# Patient Record
Sex: Female | Born: 1975 | Race: Black or African American | Hispanic: No | Marital: Married | Smoking: Never smoker
Health system: Southern US, Community
[De-identification: ages and names within clinical notes are randomized; demographics above are authoritative.]

## PROBLEM LIST (undated history)

## (undated) ENCOUNTER — Inpatient Hospital Stay (HOSPITAL_COMMUNITY): Payer: Self-pay

## (undated) DIAGNOSIS — D563 Thalassemia minor: Secondary | ICD-10-CM

## (undated) DIAGNOSIS — Z789 Other specified health status: Secondary | ICD-10-CM

## (undated) DIAGNOSIS — I10 Essential (primary) hypertension: Secondary | ICD-10-CM

## (undated) HISTORY — DX: Essential (primary) hypertension: I10

## (undated) HISTORY — PX: BREAST BIOPSY: SHX20

---

## 2002-07-13 ENCOUNTER — Inpatient Hospital Stay (HOSPITAL_COMMUNITY): Admission: AD | Admit: 2002-07-13 | Discharge: 2002-07-13 | Payer: Self-pay

## 2002-07-17 ENCOUNTER — Ambulatory Visit (HOSPITAL_COMMUNITY): Admission: RE | Admit: 2002-07-17 | Discharge: 2002-07-17 | Payer: Self-pay

## 2002-07-19 ENCOUNTER — Encounter (INDEPENDENT_AMBULATORY_CARE_PROVIDER_SITE_OTHER): Payer: Self-pay | Admitting: Specialist

## 2002-07-19 ENCOUNTER — Ambulatory Visit (HOSPITAL_COMMUNITY): Admission: RE | Admit: 2002-07-19 | Discharge: 2002-07-19 | Payer: Self-pay

## 2004-05-04 ENCOUNTER — Inpatient Hospital Stay (HOSPITAL_COMMUNITY): Admission: AD | Admit: 2004-05-04 | Discharge: 2004-05-04 | Payer: Self-pay | Admitting: Obstetrics

## 2004-05-08 ENCOUNTER — Inpatient Hospital Stay (HOSPITAL_COMMUNITY): Admission: AD | Admit: 2004-05-08 | Discharge: 2004-05-09 | Payer: Self-pay | Admitting: Obstetrics

## 2004-12-02 ENCOUNTER — Inpatient Hospital Stay (HOSPITAL_COMMUNITY): Admission: AD | Admit: 2004-12-02 | Discharge: 2004-12-05 | Payer: Self-pay | Admitting: Gynecology

## 2004-12-02 ENCOUNTER — Encounter (INDEPENDENT_AMBULATORY_CARE_PROVIDER_SITE_OTHER): Payer: Self-pay | Admitting: *Deleted

## 2007-01-30 ENCOUNTER — Encounter: Admission: RE | Admit: 2007-01-30 | Discharge: 2007-01-30 | Payer: Self-pay | Admitting: Occupational Medicine

## 2008-01-10 ENCOUNTER — Ambulatory Visit (HOSPITAL_COMMUNITY): Admission: RE | Admit: 2008-01-10 | Discharge: 2008-01-10 | Payer: Self-pay | Admitting: Obstetrics

## 2008-01-19 ENCOUNTER — Encounter (INDEPENDENT_AMBULATORY_CARE_PROVIDER_SITE_OTHER): Payer: Self-pay | Admitting: Obstetrics

## 2008-01-19 ENCOUNTER — Ambulatory Visit (HOSPITAL_COMMUNITY): Admission: AD | Admit: 2008-01-19 | Discharge: 2008-01-19 | Payer: Self-pay | Admitting: Obstetrics & Gynecology

## 2008-11-11 ENCOUNTER — Ambulatory Visit (HOSPITAL_COMMUNITY): Admission: RE | Admit: 2008-11-11 | Discharge: 2008-11-11 | Payer: Self-pay | Admitting: Gynecology

## 2009-02-06 ENCOUNTER — Encounter: Admission: RE | Admit: 2009-02-06 | Discharge: 2009-02-06 | Payer: Self-pay | Admitting: Emergency Medicine

## 2009-04-07 ENCOUNTER — Encounter: Admission: RE | Admit: 2009-04-07 | Discharge: 2009-04-07 | Payer: Self-pay | Admitting: Internal Medicine

## 2009-04-16 ENCOUNTER — Encounter: Admission: RE | Admit: 2009-04-16 | Discharge: 2009-04-16 | Payer: Self-pay | Admitting: Occupational Medicine

## 2010-09-25 ENCOUNTER — Encounter: Payer: Self-pay | Admitting: Obstetrics & Gynecology

## 2010-11-29 ENCOUNTER — Encounter (HOSPITAL_BASED_OUTPATIENT_CLINIC_OR_DEPARTMENT_OTHER): Payer: BC Managed Care – PPO | Admitting: Internal Medicine

## 2010-11-29 ENCOUNTER — Other Ambulatory Visit: Payer: Self-pay | Admitting: Internal Medicine

## 2010-11-29 ENCOUNTER — Encounter: Payer: Self-pay | Admitting: Internal Medicine

## 2010-11-29 DIAGNOSIS — D563 Thalassemia minor: Secondary | ICD-10-CM

## 2010-11-29 LAB — CBC & DIFF AND RETIC
Basophils Absolute: 0 10*3/uL (ref 0.0–0.1)
EOS%: 6.1 % (ref 0.0–7.0)
HCT: 35.2 % (ref 34.8–46.6)
HGB: 11.3 g/dL — ABNORMAL LOW (ref 11.6–15.9)
MCH: 21.1 pg — ABNORMAL LOW (ref 25.1–34.0)
MCV: 65.7 fL — ABNORMAL LOW (ref 79.5–101.0)
MONO%: 5.3 % (ref 0.0–14.0)
NEUT%: 39.3 % (ref 38.4–76.8)

## 2010-12-01 LAB — COMPREHENSIVE METABOLIC PANEL
Albumin: 4.5 g/dL (ref 3.5–5.2)
BUN: 11 mg/dL (ref 6–23)
Calcium: 9.5 mg/dL (ref 8.4–10.5)
Chloride: 101 mEq/L (ref 96–112)
Glucose, Bld: 95 mg/dL (ref 70–99)
Potassium: 3.9 mEq/L (ref 3.5–5.3)

## 2010-12-01 LAB — IRON AND TIBC
%SAT: 23 % (ref 20–55)
Iron: 74 ug/dL (ref 42–145)

## 2010-12-01 LAB — FERRITIN: Ferritin: 16 ng/mL (ref 10–291)

## 2010-12-01 LAB — HEMOGLOBINOPATHY EVALUATION
Hgb A2 Quant: 5.6 % — ABNORMAL HIGH (ref 2.2–3.2)
Hgb A: 88.3 % — ABNORMAL LOW (ref 96.8–97.8)

## 2010-12-01 LAB — VITAMIN B12: Vitamin B-12: 954 pg/mL — ABNORMAL HIGH (ref 211–911)

## 2010-12-30 ENCOUNTER — Encounter (HOSPITAL_BASED_OUTPATIENT_CLINIC_OR_DEPARTMENT_OTHER): Payer: BC Managed Care – PPO | Admitting: Internal Medicine

## 2010-12-30 ENCOUNTER — Other Ambulatory Visit: Payer: Self-pay | Admitting: Internal Medicine

## 2010-12-30 DIAGNOSIS — D563 Thalassemia minor: Secondary | ICD-10-CM

## 2010-12-30 LAB — CBC WITH DIFFERENTIAL/PLATELET
BASO%: 0.7 % (ref 0.0–2.0)
EOS%: 7.3 % — ABNORMAL HIGH (ref 0.0–7.0)
LYMPH%: 56.8 % — ABNORMAL HIGH (ref 14.0–49.7)
MCH: 21.7 pg — ABNORMAL LOW (ref 25.1–34.0)
MCHC: 31.3 g/dL — ABNORMAL LOW (ref 31.5–36.0)
MONO#: 0.3 10*3/uL (ref 0.1–0.9)
Platelets: 206 10*3/uL (ref 145–400)
RBC: 5.09 10*6/uL (ref 3.70–5.45)
WBC: 4.3 10*3/uL (ref 3.9–10.3)

## 2011-01-18 NOTE — Op Note (Signed)
NAMEAngeliyah, [name]           ACCOUNT NO.:  [id number]   MEDICAL RECORD NO.:  [id number]          PATIENT TYPE:  AMB   LOCATION:  [facility]                          FACILITY:  [facility]   PHYSICIAN:  Kathreen Cosier, M.D.DATE OF BIRTH:  Feb 11, 1976   DATE OF PROCEDURE:  01/19/2008  DATE OF DISCHARGE:                               OPERATIVE REPORT   PREOPERATIVE DIAGNOSES:  1. An 8 weeks plus fetal demise.  2. Threatened abortion.   POSTOPERATIVE DIAGNOSES:  1. An 8 weeks plus fetal demise.  2. Threatened abortion.   PROCEDURE:  Dilatation and evacuation.   Using MAC and the patient in lithotomy position, the perineum and vagina  were prepped and draped.  Bladder emptied with straight catheter.  Bimanual exam, uterus 8-10 weeks' size.  Speculum was placed in the  vagina.  Cervix was injected with 10 mL of 1% Xylocaine.  The cervix was  dilated 27 Pratt and a #10 suction was used to aspirate the uterine  contents until the cavity was clean.  The patient tolerated the  procedure well and taken to recovery room in good condition.           ______________________________  Kathreen Cosier, M.D.     BAM/MEDQ  D:  01/19/2008  T:  01/20/2008  Job:  [id number]

## 2011-01-21 NOTE — Discharge Summary (Signed)
NAMELoany, [name]           ACCOUNT NO.:  [id number]   MEDICAL RECORD NO.:  [id number]          PATIENT TYPE:  INP   LOCATION:  9118                          [facility]ILITY:  [facility]   PHYSICIAN:  Charles A. Clearance Coots, M.D.DATE OF BIRTH:  [birth year]   DATE OF ADMISSION:  12/02/2004  DATE OF DISCHARGE:  12/05/2004                                 DISCHARGE SUMMARY   ADMITTING DIAGNOSES:  1.  Forty weeks gestation.  2.  Breech presentation.   DISCHARGE DIAGNOSES:  1.  Forty weeks gestation.  2.  Breech presentation.  3.  Status post primary low transverse cesarean section on December 02, 2004.      A viable female was delivered at 0922, Apgars of 9 at one minute 9 at five      minutes, weight of 8 pounds 1 ounce.  Mother and infant discharged home      in good condition.   REASON FOR ADMISSION:  A 35 year old G1 black female, estimated date of  confinement of November 29, 2004, presented for scheduled cesarean section  delivery for a breech presentation at [redacted] weeks gestation.   PAST MEDICAL HISTORY:   SURGERY:  Laparoscopy.   ILLNESSES:  None.   MEDICATIONS:  Prenatal vitamins.   ALLERGIES:  No known drug allergies.   SOCIAL HISTORY:  Negative for tobacco, alcohol, or recreational drug use.   PHYSICAL EXAMINATION:  GENERAL:  Well-nourished, well-developed female in no  acute distress.  VITAL SIGNS:  Temperature 98.1, pulse 78, blood pressure 129/78.  LUNGS:  Clear to auscultation bilaterally.  HEART:  Regular rate and rhythm.  ABDOMEN:  Gravid, nontender.  PELVIC:  Cervix long, closed, breech presentation.   ADMITTING LABORATORY VALUES:  Hemoglobin 10, hematocrit 33, white blood cell  count 5000, platelets 154,000.  Comprehensive metabolic panel within normal  limits.  RPR was nonreactive.   HOSPITAL COURSE:  The patient underwent a primary low transverse cesarean  section on December 02, 2004.  There were no intraoperative complications.  Postoperative course was uncomplicated.  The  patient was discharged home on  postop day #3 in good condition.   DISCHARGE LABORATORY VALUES:  Hemoglobin 8.6, hematocrit 26.1, white blood  cell count 6200, platelets 132,000.   DISCHARGE DISPOSITION:   MEDICATIONS:  1.  Continue prenatal vitamins.  2.  Iron was prescribed for anemia.   Routine written instructions per booklet were given for status post  discharge after cesarean section.  The patient is to call the office for a  follow up appointment in 2 weeks.      CAH/MEDQ  D:  01/06/2005  T:  01/06/2005  Job:  [id number]

## 2011-01-21 NOTE — Op Note (Signed)
[name], [name]           ACCOUNT NO.:  [id number]   MEDICAL RECORD NO.:  [id number]          PATIENT TYPE:  INP   LOCATION:  9118                          [facility]ILITY:  [facility]   PHYSICIAN:  Charles A. Clearance Coots, M.D.DATE OF BIRTH:  February 13, 1976   DATE OF PROCEDURE:  12/02/2004  DATE OF DISCHARGE:                                 OPERATIVE REPORT   PREOPERATIVE DIAGNOSES:  1.  [redacted] weeks gestation.  2.  Breech presentation.   POSTOPERATIVE DIAGNOSES:  1.  [redacted] weeks gestation.  2.  Breech presentation.   PROCEDURE:  Primary low transverse cesarean section.   SURGEON:  Charles A. Clearance Coots, M.D.   ASSISTANT:  Corey Skains, C.S.T.   ANESTHESIA:  Spinal.   ESTIMATED BLOOD LOSS:  1000 mL.   IV FLUIDS:  3000 mL.   URINE OUTPUT:  300 mL, clear.   COMPLICATIONS:  None.   DRAINS:  Foley to gravity.   FINDINGS:  Viable female at 29, Apgars of 9 at one minute and 9 at five  minutes, weight 8 pounds 1 ounce.  Normal uterus, ovaries, and fallopian  tubes.   DESCRIPTION OF PROCEDURE:  The patient was brought to the operating room.  After satisfactory spinal anesthesia, the abdomen was prepped and draped in  the usual sterile fashion.   A Pfannenstiel skin incision was made with the scalpel that was deepened  down to the fascia with the scalpel.  The fascia was nicked in the midline,  and the fascial incision was extended to the left and to the right with  curved Mayo scissors.  The superior and inferior fascial edges were taken  off of the rectus muscles with both blunt and sharp dissection.  The rectus  muscles were bluntly and sharply divided in the midline, and the peritoneum  was entered digitally and was digitally extended to the left and to the  right.  The bladder blade was then positioned, and the vesicouterine fold of  peritoneum above the reflection of urinary bladder was grasped with forceps  and was incised and undermined with Metzenbaum scissors.  The incision was  extended to the left and to the right with the Metzenbaum scissors.  The  bladder flap was bluntly developed, and the bladder blade was repositioned  in front of the urinary bladder, placing it well out of the operative field.  The uterus was then entered transversely and the lower uterine segment with  the scalpel down to the amniotic sac.  The uterine incision was then  extended to the left and to the right with the bandage scissors.  The  amniotic sac was ruptured, and clear fluid was expelled.  The delivery was  then accomplished as a frank breech delivery in routine fashion without  complication.  The umbilical cord was doubly clamped and cut, and the  infant's mouth and nose were suctioned with a suction bulb.  The infant was  then handed off to the nursery staff.  Cord blood was obtained, and the  placenta was spontaneously expelled from the uterine cavity intact.  The  edges of the uterine incision were then grasped with  ring forceps, and the  uterus was closed with a continuous interlocking suture of 0 Monocryl from  each corner to the center.  Hemostasis was excellent.  The pelvic cavity was  then thoroughly irrigated with warm saline solution, and all clots were  removed.  The abdomen was then closed as follows.  The peritoneum was closed  with a continuous suture of 2-0 Monocryl.  The fascia was closed with  continuous suture of 0 PDS from each corner to the center.  The subcutaneous  tissue was thoroughly irrigated with warm saline solution, and all areas of  subcutaneous bleeding were coagulated with the Bovie.  The skin was then  closed with a continuous subcuticular suture of 3-0 Monocryl.  A sterile  bandage was applied to the incision closure.  The surgical technician  indicated that all needle, sponge, and instrument counts were correct.   The patient tolerated the procedure well and was transported to the recovery  room in satisfactory condition.      CAH/MEDQ  D:   12/02/2004  T:  12/02/2004  Job:  [id number]

## 2011-01-21 NOTE — Op Note (Signed)
   NAME:  [name], [name]                      ACCOUNT NO.:  [id number]   MEDICAL RECORD NO.:  [id number]                   PATIENT TYPE:  AMB   LOCATION:  [facility]                                  [facility]   PHYSICIAN:  Ronda Fairly. Galen Daft, M.D.              DATE OF BIRTH:  12/21/1975   DATE OF PROCEDURE:  07/19/2002  DATE OF DISCHARGE:                                 OPERATIVE REPORT   PREOPERATIVE DIAGNOSES:  Incomplete abortion.   POSTOPERATIVE DIAGNOSES:  Incomplete abortion.   PROCEDURE:  Suction dilatation and evacuation.   SURGEON:  Ronda Fairly. Galen Daft, M.D.   ANESTHESIA:  MAC with local anesthesia.   COMPLICATIONS:  None.   ESTIMATED BLOOD LOSS:  Less than 10 cc.   PROCEDURE:  The patient was identified as [employer] representative.  Informed  consent was obtained and she was brought to the operating room.  The cervix  already had been dilated just naturally to accept the 10 mm suction curette.  Lidocaine was utilized for 10 cc total of 1% lidocaine and this was done in  all four quadrants of the cervix.  Care was taken to avoid intravascular  injection.  The uterus sounded to 10 cm.  The suction curette was placed  into the fundus.  Pressure was up to 60 and products of conception were  removed without difficulty.  Sharp curette in all the four quadrants showed  that there was a complete procedure without any additional tissue obtained.  The patient had no active bleeding at the end of the procedure.  She had  voided prior to the procedure and there were no complications.  She was  discharged home on doxycycline and Percocet and to follow up in the office  in three to four weeks.  All activity limits, wound care, follow-up in the  office, and diet discussed with the patient prior to discharge.  Instrument,  sponge, and needle count were correct at the end of the case and there were  no complications.                                               Ronda Fairly. Galen Daft,  M.D.    NJT/MEDQ  D:  07/19/2002  T:  07/19/2002  Job:  [id number]

## 2011-06-01 LAB — CBC
HCT: 35.6 — ABNORMAL LOW
Platelets: 229
WBC: 5

## 2011-06-01 LAB — ABO/RH: ABO/RH(D): O POS

## 2011-09-01 LAB — OB RESULTS CONSOLE GC/CHLAMYDIA
Chlamydia: NEGATIVE
Gonorrhea: NEGATIVE

## 2011-09-13 ENCOUNTER — Other Ambulatory Visit: Payer: Self-pay

## 2011-10-04 LAB — OB RESULTS CONSOLE RUBELLA ANTIBODY, IGM: Rubella: IMMUNE

## 2011-10-05 ENCOUNTER — Other Ambulatory Visit: Payer: Self-pay

## 2011-10-11 ENCOUNTER — Encounter (HOSPITAL_COMMUNITY): Payer: Self-pay

## 2011-10-11 ENCOUNTER — Ambulatory Visit (HOSPITAL_COMMUNITY)
Admission: RE | Admit: 2011-10-11 | Discharge: 2011-10-11 | Disposition: A | Discharge status: Home / Self Care | Payer: BC Managed Care – PPO | Source: Ambulatory Visit | Attending: Obstetrics & Gynecology | Admitting: Obstetrics & Gynecology

## 2011-10-11 DIAGNOSIS — D563 Thalassemia minor: Secondary | ICD-10-CM | POA: Insufficient documentation

## 2011-10-11 DIAGNOSIS — O2620 Pregnancy care for patient with recurrent pregnancy loss, unspecified trimester: Secondary | ICD-10-CM | POA: Insufficient documentation

## 2011-10-11 DIAGNOSIS — O09529 Supervision of elderly multigravida, unspecified trimester: Secondary | ICD-10-CM | POA: Insufficient documentation

## 2011-10-11 DIAGNOSIS — Z3689 Encounter for other specified antenatal screening: Secondary | ICD-10-CM

## 2011-10-11 DIAGNOSIS — O358XX0 Maternal care for other (suspected) fetal abnormality and damage, not applicable or unspecified: Secondary | ICD-10-CM

## 2011-10-11 NOTE — Progress Notes (Signed)
Genetic Counseling  High-Risk Gestation Note  Appointment Date:  10/11/2011 Referred By: Roseanna Rainbow, * Date of Birth:  08/26/1976 Attending: Rema Fendt, MD  Ms. Bama A Farewell was seen for genetic counseling because of a maternal age of 36 y.o. and given that she has beta thalassemia trait.   She was counseled regarding maternal age and the association with risk for chromosome conditions due to nondisjunction with aging of the ova.   We reviewed chromosomes, nondisjunction, and the associated 1 in 12 risk for fetal aneuploidy at [redacted]w[redacted]d gestation related to a maternal age of 36 y.o. at delivery.  She was counseled that the risk for aneuploidy decreases as gestational age increases, accounting for those pregnancies which spontaneously abort.  We specifically discussed Down syndrome (trisomy 24), trisomies 71 and 74, and sex chromosome aneuploidies (47,XXX and 47,XXY) including the common features and prognoses of each.   We reviewed available screening and diagnostic options.  Regarding screening tests, we discussed the options of First screen, Quad screen and ultrasound.  She understands that screening tests are used to modify a patient's a priori risk for aneuploidy, typically based on age.  This estimate provides a pregnancy specific risk assessment. We discussed another type of screening test, noninvasive prenatal testing (NIPT), which utilizes cell free fetal DNA found in the maternal circulation. This test is not diagnostic for chromosome conditions, but can provide information regarding the presence or absence of extra fetal DNA for chromosomes 13, 18 and 21. Thus, it would not identify or rule out all fetal aneuploidy. The reported detection rate is greater than 99% for Trisomy 21, greater than 97% for Trisomy 18, and is approximately 80% (8 out of 10) for Trisomy 13. The false positive rate is thought to be less than 0.1% for any of these conditions.  We also reviewed the  availability of diagnostic options including CVS and amniocentesis.  We discussed the risks, limitations, and benefits of each.    After reviewing these options, Ms. Zurn elected to have targeted ultrasound in the second trimester (scheduled for 11/15/2011), but declined all additional screening and testing for aneuploidy (including First screen, cell free fetal DNA testing, CVS, and amniocentesis) given that she feels these tests have the potential to provoke additional stress for her during pregnancy. She understands that ultrasound cannot rule out all birth defects or genetic syndromes. The patient was advised of this limitation and states she still does not want diagnostic testing at this time or in the future given the associated risk of complications.  However, she was counseled that 50-80% of fetuses with Down syndrome and up to 90% of fetuses with trisomies 13 and 18, when well visualized, have detectable anomalies or soft markers by ultrasound.   Both family histories were reviewed and found to be contributory for sickle cell disease. The patient previously had a complete blood count which indicated low mean corpuscular volume (MCV) (66.5), low mean corpuscular hemoglobin (21.2), and low hemoglobin (10.9). 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 Ms. Carillo reported that the father of the pregnancy is planning to have testing at her OB office in the near future. We discussed that testing is available to him via complete blood count, hemoglobin electrophoresis with quantitative A2, and ferritin studies. We are also available to facilitate this testing, if desired.   Hemoglobin is the oxygen-carrying pigment of  red blood cells. The type of hemoglobin we have is determined by inheritance. Thalassemias and  sickle cell disease are inherited blood disorders caused by abnormal production of hemoglobin. We discussed that Sickle Cell anemia (SCA), Beta Thalassemia, and sickle-beta thalassemia are hemoglobinopathies in which there is an inherited structural abnormality in one of the globin chains, specifically the beta globin chain. Beta-Thalassemia is a disease characterized by a decrease in the ability of the blood to carry oxygen, which often causes anemia, hepatosplenomegaly, and failure to thrive in infancy.  Treatment typically consists of regular blood transfusions and chelation therapy.   We reviewed the autosomal recessive inheritance of beta thalassemia and sickle cell disease, which are both due to mutations in the HBB gene. We discussed that if both parents are carriers for beta thalassemia or beta globin chain variant, each pregnancy has a 1 in 4 chance to inherit beta thalassemia or hemoglobinopathy. There is also a 1 in 4 chance to have a child who is not a carrier nor affected with hemoglobinopathy and a 1 in 2 chance to have a child who is a carrier. We discussed that if the father of the pregnancy has typical hemoglobin (Hgb AA) and is not a carrier for a beta globin chain variant, then the pregnancy would not be at risk for thalassemia or hemoglobinopathy but would have a 1 in 2 chance to be a carrier. We discussed that prenatal diagnosis is available via amniocentesis, if desired, in the case where both parents are identified carriers. If this is desired, molecular testing would first be needed to confirm the beta thalassemia carrier status in Ms. Maniscalco and identify the causative mutation. She understands that although the ultrasound may appear normal, the risk of anomalies cannot be completely eliminated, and that a baby with thalassemia would not appear different on a prenatal ultrasound. Ms. Garbett stated that her partner plans to pursue carrier testing. However, she stated that she would not  be interested in prenatal diagnosis via amniocentesis in the event that he is identified to carry a hemoglobin variant.   Additionally, Ms. Forge reported that she and her husband have a history of 4 first trimester miscarriages, in addition to their 40 year-old son. An underlying cause is not known for her miscarriages. Approximately 1 in 6 confirmed pregnancies results in miscarriage. A single underlying cause is more likely to be suspected when a couple has experienced 3 or more losses. It is less likely that there will be an identifiable single underlying cause when a couple has experienced less than 3 losses. We discussed several possible causes including chromosome rearrangements, antibodies, and thrombophilia. We reviewed chromosomes and examples of chromosome conditions. In approximately 3-8% of couples with recurrent pregnancy loss, one partner carries a chromosome variant, such as a balanced translocation. Being a carrier of a chromosome variant can increase the risk for abnormalities in the sperm or egg cell, which can increase the risk for miscarriage or the birth of a child with birth defects and/or mental retardation. We reviewed that inherited predisposition to clotting can also increase the risk for miscarriage given the association with increased risk for disrupted blood flow in the pregnancy. Additionally, the presence of certain antibodies have been associated with an increased risk for miscarriage. The patient may contact us should she desire pursuing studies in an attempt to identify an underlying cause for the previous miscarriages. The patient is not interested in testing at the time of today's visit. The family history is otherwise unremarkable for  birth defects, mental retardation, recurrent pregnancy loss, and known genetic conditions.  Without further information regarding the provided family history, an accurate genetic risk cannot be calculated. Further genetic counseling is  warranted if more information is obtained.  Ms. Gewirtz denied exposure to environmental toxins or chemical agents. She denied the use of alcohol, tobacco or street drugs. She denied significant viral illnesses during the course of her pregnancy. Her medical and surgical histories were contributory for four previous first trimester SABs.   I counseled Ms. Deziree A Genna regarding the above risks and available options.  The approximate face-to-face time with the genetic counselor was 25 minutes.  Quinn Plowman, MS,  Certified Genetic Counselor 10/11/2011

## 2011-11-15 ENCOUNTER — Other Ambulatory Visit: Payer: Self-pay

## 2011-11-15 ENCOUNTER — Ambulatory Visit (HOSPITAL_COMMUNITY)
Admission: RE | Admit: 2011-11-15 | Discharge: 2011-11-15 | Disposition: A | Discharge status: Home / Self Care | Payer: BC Managed Care – PPO | Source: Ambulatory Visit | Attending: Obstetrics & Gynecology | Admitting: Obstetrics & Gynecology

## 2011-11-15 ENCOUNTER — Encounter (HOSPITAL_COMMUNITY): Payer: Self-pay

## 2011-11-15 DIAGNOSIS — O09529 Supervision of elderly multigravida, unspecified trimester: Secondary | ICD-10-CM | POA: Insufficient documentation

## 2011-11-15 DIAGNOSIS — Z363 Encounter for antenatal screening for malformations: Secondary | ICD-10-CM | POA: Insufficient documentation

## 2011-11-15 DIAGNOSIS — O34219 Maternal care for unspecified type scar from previous cesarean delivery: Secondary | ICD-10-CM | POA: Insufficient documentation

## 2011-11-15 DIAGNOSIS — Z1389 Encounter for screening for other disorder: Secondary | ICD-10-CM | POA: Insufficient documentation

## 2011-11-15 DIAGNOSIS — O2620 Pregnancy care for patient with recurrent pregnancy loss, unspecified trimester: Secondary | ICD-10-CM | POA: Insufficient documentation

## 2011-11-15 DIAGNOSIS — Z3689 Encounter for other specified antenatal screening: Secondary | ICD-10-CM

## 2011-11-15 DIAGNOSIS — O358XX0 Maternal care for other (suspected) fetal abnormality and damage, not applicable or unspecified: Secondary | ICD-10-CM | POA: Insufficient documentation

## 2011-11-15 HISTORY — DX: Thalassemia minor: D56.3

## 2011-11-22 ENCOUNTER — Other Ambulatory Visit: Payer: Self-pay | Admitting: Obstetrics

## 2011-11-29 ENCOUNTER — Telehealth (HOSPITAL_COMMUNITY): Payer: Self-pay | Admitting: MS"

## 2011-11-29 NOTE — Telephone Encounter (Signed)
Left message for patient to return call.

## 2011-11-29 NOTE — Telephone Encounter (Signed)
Called [name] to discuss her [facility], cell free fetal DNA testing.  We reviewed that these are within normal limits, showing a less than 1 in 10,000 risk for trisomies 21, 18 and 13.  We reviewed that this testing identifies > 99% of pregnancies with trisomy 21, >97% of pregnancies with trisomy 50, and >80% with trisomy 52; the false positive rate is <0.1% for all conditions.  She understands that this testing does not identify all genetic conditions.   We reviewed the ultrasound finding of echogenic intracardiac focus. An isolated echogenic focus is generally believed to be a normal variation without any concerns for the pregnancy.  Isolated echogenic cardiac foci are not associated with congenital heart defects in the baby or compromised cardiac function after birth.    We also reviewed that her husband's hemoglobin electrophoresis, complete blood count, and ferritin studies were normal, indicating the presence of normal hemoglobin (Hb AA).    All questions were answered to her satisfaction, she was encouraged to call with additional questions or concerns.  Quinn Plowman, MS Patent attorney

## 2011-12-28 LAB — OB RESULTS CONSOLE HIV ANTIBODY (ROUTINE TESTING): HIV: NONREACTIVE

## 2012-01-11 ENCOUNTER — Inpatient Hospital Stay (HOSPITAL_COMMUNITY)
Admission: AD | Admit: 2012-01-11 | Discharge: 2012-01-11 | Disposition: A | Discharge status: Home / Self Care | Payer: BC Managed Care – PPO | Source: Ambulatory Visit | Attending: Obstetrics & Gynecology | Admitting: Obstetrics & Gynecology

## 2012-01-11 ENCOUNTER — Encounter (HOSPITAL_COMMUNITY): Payer: Self-pay | Admitting: *Deleted

## 2012-01-11 DIAGNOSIS — O99891 Other specified diseases and conditions complicating pregnancy: Secondary | ICD-10-CM | POA: Insufficient documentation

## 2012-01-11 DIAGNOSIS — R51 Headache: Secondary | ICD-10-CM | POA: Insufficient documentation

## 2012-01-11 DIAGNOSIS — O10019 Pre-existing essential hypertension complicating pregnancy, unspecified trimester: Secondary | ICD-10-CM | POA: Insufficient documentation

## 2012-01-11 DIAGNOSIS — O169 Unspecified maternal hypertension, unspecified trimester: Secondary | ICD-10-CM

## 2012-01-11 HISTORY — DX: Other specified health status: Z78.9

## 2012-01-11 LAB — URINALYSIS, ROUTINE W REFLEX MICROSCOPIC
Leukocytes, UA: NEGATIVE
Nitrite: NEGATIVE
Protein, ur: NEGATIVE mg/dL
Specific Gravity, Urine: 1.01 (ref 1.005–1.030)
Urobilinogen, UA: 0.2 mg/dL (ref 0.0–1.0)

## 2012-01-11 LAB — COMPREHENSIVE METABOLIC PANEL
ALT: 16 U/L (ref 0–35)
AST: 18 U/L (ref 0–37)
Albumin: 2.7 g/dL — ABNORMAL LOW (ref 3.5–5.2)
CO2: 23 mEq/L (ref 19–32)
Chloride: 102 mEq/L (ref 96–112)
GFR calc non Af Amer: >90 mL/min (ref >90)
Sodium: 135 mEq/L (ref 135–145)
Total Bilirubin: 0.3 mg/dL (ref 0.3–1.2)

## 2012-01-11 LAB — CBC
Platelets: 156 10*3/uL (ref 150–400)
RBC: 4.37 MIL/uL (ref 3.87–5.11)
RDW: 17.7 % — ABNORMAL HIGH (ref 11.5–15.5)
WBC: 6 10*3/uL (ref 4.0–10.5)

## 2012-01-11 MED ORDER — OXYCODONE-ACETAMINOPHEN 5-325 MG PO TABS: 1.0000 | ORAL_TABLET | ORAL | Status: AC | PRN

## 2012-01-11 NOTE — MAU Provider Note (Signed)
History     CSN: [id number]  Arrival date and time: 01/11/12 1950   First Provider Initiated Contact with Patient 01/11/12 2033      Chief Complaint  Patient presents with  . Headache   HPI This is a 36 y.o. female at [redacted]w[redacted]d who presents with complaint of Headache. She took Tylenol 3000mg  in past day, but last at 3am.  She took her BP at home and it was elevated.  Pt reports "since last night i have had a terrible headache and i have taken 3000 mg of tylenol since last pm and it hasn't helped at all" states she checked her b/p at home 189/90.       OB History    Grav Para Term Preterm Abortions TAB SAB Ect Mult Living   6 1 1  0 4 0 4 0 0 1     Filed Vitals:   01/11/12 2003  BP: 131/87  Pulse: 95  Temp:   Resp:      Past Medical History  Diagnosis Date  . Beta thalassemia trait   . No pertinent past medical history     Past Surgical History  Procedure Date  . Cesarean section     Family History  Problem Relation Age of Onset  . Sickle cell anemia Brother   . Hypertension Mother     History  Substance Use Topics  . Smoking status: Never Smoker   . Smokeless tobacco: Not on file  . Alcohol Use: No    Allergies:  Allergies  Allergen Reactions  . Amoxicillin Rash    Prescriptions prior to admission  Medication Sig Dispense Refill  . loratadine (CLARITIN) 10 MG tablet Take 10 mg by mouth daily as needed. For cough/allergies      . omeprazole (PRILOSEC) 20 MG capsule Take 20 mg by mouth every morning.      . ondansetron (ZOFRAN-ODT) 8 MG disintegrating tablet Take 8 mg by mouth every 8 (eight) hours as needed. For nausea and vomiting      . Prenatal Vit-Fe Fumarate-FA (PRENATAL MULTIVITAMIN) TABS Take 1 tablet by mouth daily.        Review of Systems  Constitutional: Negative for fever and chills.  Eyes: Negative for blurred vision.  Gastrointestinal: Negative for abdominal pain.  Neurological: Positive for headaches.    Physical Exam   Blood  pressure 131/87, pulse 95, temperature 97.6 F (36.4 C), temperature source Oral, resp. rate 20, height 5\' 5"  (1.651 m), weight 208 lb (94.348 kg), last menstrual period 07/08/2011, SpO2 100.00%.  Physical Exam  Constitutional: She is oriented to person, place, and time. She appears well-developed and well-nourished.  HENT:  Head: Normocephalic.  Cardiovascular: Normal rate.   Respiratory: Effort normal.  GI: Soft.  Musculoskeletal: Normal range of motion.  Neurological: She is alert and oriented to person, place, and time. She has normal reflexes. She displays normal reflexes.  Skin: Skin is warm and dry.  Psychiatric: She has a normal mood and affect.   FHR reassuring. No contractions  MAU Course  Procedures  MDM Results for orders placed during the hospital encounter of 01/11/12 (from the past 24 hour(s))  URINALYSIS, ROUTINE W REFLEX MICROSCOPIC     Status: Normal   Collection Time   01/11/12  8:05 PM      Component Value Range   Color, Urine YELLOW  YELLOW    APPearance CLEAR  CLEAR    Specific Gravity, Urine 1.010  1.005 - 1.030    pH  6.5  5.0 - 8.0    Glucose, UA NEGATIVE  NEGATIVE (mg/dL)   Hgb urine dipstick NEGATIVE  NEGATIVE    Bilirubin Urine NEGATIVE  NEGATIVE    Ketones, ur NEGATIVE  NEGATIVE (mg/dL)   Protein, ur NEGATIVE  NEGATIVE (mg/dL)   Urobilinogen, UA 0.2  0.0 - 1.0 (mg/dL)   Nitrite NEGATIVE  NEGATIVE    Leukocytes, UA NEGATIVE  NEGATIVE   CBC     Status: Abnormal   Collection Time   01/11/12  8:35 PM      Component Value Range   WBC 6.0  4.0 - 10.5 (K/uL)   RBC 4.37  3.87 - 5.11 (MIL/uL)   Hemoglobin 9.6 (*) 12.0 - 15.0 (g/dL)   HCT 82.9 (*) 56.2 - 46.0 (%)   MCV 65.0 (*) 78.0 - 100.0 (fL)   MCH 22.0 (*) 26.0 - 34.0 (pg)   MCHC 33.8  30.0 - 36.0 (g/dL)   RDW 13.0 (*) 86.5 - 15.5 (%)   Platelets 156  150 - 400 (K/uL)  COMPREHENSIVE METABOLIC PANEL     Status: Abnormal   Collection Time   01/11/12  8:35 PM      Component Value Range   Sodium 135   135 - 145 (mEq/L)   Potassium 3.6  3.5 - 5.1 (mEq/L)   Chloride 102  96 - 112 (mEq/L)   CO2 23  19 - 32 (mEq/L)   Glucose, Bld 121 (*) 70 - 99 (mg/dL)   BUN 8  6 - 23 (mg/dL)   Creatinine, Ser 7.84  0.50 - 1.10 (mg/dL)   Calcium 9.2  8.4 - 69.6 (mg/dL)   Total Protein 6.4  6.0 - 8.3 (g/dL)   Albumin 2.7 (*) 3.5 - 5.2 (g/dL)   AST 18  0 - 37 (U/L)   ALT 16  0 - 35 (U/L)   Alkaline Phosphatase 62  39 - 117 (U/L)   Total Bilirubin 0.3  0.3 - 1.2 (mg/dL)   GFR calc non Af Amer >90  >90 (mL/min)   GFR calc Af Amer >90  >90 (mL/min)  URIC ACID     Status: Normal   Collection Time   01/11/12  8:35 PM      Component Value Range   Uric Acid, Serum 3.4  2.4 - 7.0 (mg/dL)     Assessment and Plan  A:  SIUP at [redacted]w[redacted]d        Headache, improved       Labile hypertension, no evidence of preeclampsia P:  Discussed with Dr Tamela Oddi       Labs normal       Will Rx Percocet PRN       Followup in office   [facility] 01/11/2012, 9:54 PM

## 2012-01-11 NOTE — Discharge Instructions (Signed)

## 2012-01-11 NOTE — MAU Note (Signed)
Pt reports "since last night i have had a terrible headache and i have taken 3000 mg of tylenol since last pm and it hasn't helped at all" states she checked her b/p at home 189/90.

## 2012-01-25 ENCOUNTER — Other Ambulatory Visit: Payer: Self-pay

## 2012-02-06 ENCOUNTER — Other Ambulatory Visit: Payer: Self-pay | Admitting: Obstetrics

## 2012-02-06 DIAGNOSIS — O09529 Supervision of elderly multigravida, unspecified trimester: Secondary | ICD-10-CM

## 2012-02-08 ENCOUNTER — Ambulatory Visit (HOSPITAL_COMMUNITY)
Admission: RE | Admit: 2012-02-08 | Discharge: 2012-02-08 | Disposition: A | Discharge status: Home / Self Care | Payer: BC Managed Care – PPO | Source: Ambulatory Visit | Attending: Obstetrics | Admitting: Obstetrics

## 2012-02-08 DIAGNOSIS — O34219 Maternal care for unspecified type scar from previous cesarean delivery: Secondary | ICD-10-CM | POA: Insufficient documentation

## 2012-02-08 DIAGNOSIS — O2620 Pregnancy care for patient with recurrent pregnancy loss, unspecified trimester: Secondary | ICD-10-CM | POA: Insufficient documentation

## 2012-02-08 DIAGNOSIS — O09529 Supervision of elderly multigravida, unspecified trimester: Secondary | ICD-10-CM | POA: Insufficient documentation

## 2012-02-08 NOTE — Progress Notes (Signed)
Patient seen today  for follow up ultrasound.  See full report in [facility].  Alpha Gula, MD  Single IUP at 30 5/7 weeks Interval growth is appropriate (65th %) Normal amniotic fluid volume  Recommend follow up ultrasounds as clinically indicated

## 2012-02-08 NOTE — ED Notes (Signed)
Pt states + FM.  Denies any problems today.   

## 2012-03-15 ENCOUNTER — Other Ambulatory Visit: Payer: Self-pay | Admitting: Obstetrics

## 2012-03-19 LAB — OB RESULTS CONSOLE GBS: GBS: NEGATIVE

## 2012-04-16 ENCOUNTER — Inpatient Hospital Stay (HOSPITAL_COMMUNITY)
Admission: AD | Admit: 2012-04-16 | Discharge: 2012-04-20 | DRG: 371 | Disposition: A | Discharge status: Home / Self Care | Payer: BC Managed Care – PPO | Source: Ambulatory Visit | Attending: Obstetrics | Admitting: Obstetrics

## 2012-04-16 ENCOUNTER — Encounter (HOSPITAL_COMMUNITY): Payer: Self-pay | Admitting: Anesthesiology

## 2012-04-16 ENCOUNTER — Encounter (HOSPITAL_COMMUNITY): Payer: Self-pay | Admitting: *Deleted

## 2012-04-16 DIAGNOSIS — O139 Gestational [pregnancy-induced] hypertension without significant proteinuria, unspecified trimester: Secondary | ICD-10-CM | POA: Diagnosis present

## 2012-04-16 DIAGNOSIS — O09529 Supervision of elderly multigravida, unspecified trimester: Secondary | ICD-10-CM | POA: Diagnosis present

## 2012-04-16 DIAGNOSIS — O34219 Maternal care for unspecified type scar from previous cesarean delivery: Principal | ICD-10-CM | POA: Diagnosis present

## 2012-04-16 LAB — COMPREHENSIVE METABOLIC PANEL
ALT: 12 U/L (ref 0–35)
Alkaline Phosphatase: 111 U/L (ref 39–117)
BUN: 7 mg/dL (ref 6–23)
CO2: 21 mEq/L (ref 19–32)
Chloride: 103 mEq/L (ref 96–112)
GFR calc Af Amer: >90 mL/min (ref >90)
GFR calc non Af Amer: >90 mL/min (ref >90)
Glucose, Bld: 78 mg/dL (ref 70–99)
Potassium: 3.9 mEq/L (ref 3.5–5.1)
Sodium: 135 mEq/L (ref 135–145)
Total Bilirubin: 0.3 mg/dL (ref 0.3–1.2)
Total Protein: 6.1 g/dL (ref 6.0–8.3)

## 2012-04-16 LAB — CBC WITH DIFFERENTIAL/PLATELET
Eosinophils Absolute: 0.1 10*3/uL (ref 0.0–0.7)
Hemoglobin: 9.6 g/dL — ABNORMAL LOW (ref 12.0–15.0)
Lymphocytes Relative: 25 % (ref 12–46)
Lymphs Abs: 1.4 10*3/uL (ref 0.7–4.0)
MCH: 20.6 pg — ABNORMAL LOW (ref 26.0–34.0)
Monocytes Relative: 6 % (ref 3–12)
Neutro Abs: 3.9 10*3/uL (ref 1.7–7.7)
Neutrophils Relative %: 68 % (ref 43–77)
Platelets: 124 10*3/uL — ABNORMAL LOW (ref 150–400)
RBC: 4.65 MIL/uL (ref 3.87–5.11)
WBC: 5.7 10*3/uL (ref 4.0–10.5)

## 2012-04-16 LAB — URINALYSIS, ROUTINE W REFLEX MICROSCOPIC
Glucose, UA: NEGATIVE mg/dL
Ketones, ur: NEGATIVE mg/dL
Leukocytes, UA: NEGATIVE
Nitrite: NEGATIVE
Specific Gravity, Urine: 1.01 (ref 1.005–1.030)
pH: 7.5 (ref 5.0–8.0)

## 2012-04-16 LAB — URIC ACID: Uric Acid, Serum: 4.6 mg/dL (ref 2.4–7.0)

## 2012-04-16 MED ORDER — LACTATED RINGERS IV SOLN: 500.0000 mL | INTRAVENOUS | Status: DC | PRN

## 2012-04-16 MED ORDER — CITRIC ACID-SODIUM CITRATE 334-500 MG/5ML PO SOLN
30.0000 mL | ORAL | Status: DC | PRN
  Administered 2012-04-17 (×2): 30 mL via ORAL
  Filled 2012-04-16 (×2): qty 15

## 2012-04-16 MED ORDER — TERBUTALINE SULFATE 1 MG/ML IJ SOLN: 0.2500 mg | Freq: Once | INTRAMUSCULAR | Status: AC | PRN

## 2012-04-16 MED ORDER — ACETAMINOPHEN 325 MG PO TABS: 650.0000 mg | ORAL_TABLET | ORAL | Status: DC | PRN

## 2012-04-16 MED ORDER — IBUPROFEN 600 MG PO TABS: 600.0000 mg | ORAL_TABLET | Freq: Four times a day (QID) | ORAL | Status: DC | PRN

## 2012-04-16 MED ORDER — FLEET ENEMA 7-19 GM/118ML RE ENEM: 1.0000 | ENEMA | RECTAL | Status: DC | PRN

## 2012-04-16 MED ORDER — OXYTOCIN 40 UNITS IN LACTATED RINGERS INFUSION - SIMPLE MED
1.0000 m[IU]/min | INTRAVENOUS | Status: DC
  Administered 2012-04-16: 2 m[IU]/min via INTRAVENOUS
  Filled 2012-04-16: qty 1000

## 2012-04-16 MED ORDER — ONDANSETRON HCL 4 MG/2ML IJ SOLN: 4.0000 mg | Freq: Four times a day (QID) | INTRAMUSCULAR | Status: DC | PRN

## 2012-04-16 MED ORDER — BUTALBITAL-APAP-CAFFEINE 50-325-40 MG PO TABS
2.0000 | ORAL_TABLET | Freq: Four times a day (QID) | ORAL | Status: DC | PRN
  Administered 2012-04-16: 2 via ORAL
  Filled 2012-04-16 (×2): qty 2

## 2012-04-16 MED ORDER — LIDOCAINE HCL (PF) 1 % IJ SOLN: 30.0000 mL | INTRAMUSCULAR | Status: DC | PRN

## 2012-04-16 MED ORDER — TERBUTALINE SULFATE 1 MG/ML IJ SOLN: 0.2500 mg | Freq: Once | INTRAMUSCULAR | Status: DC | PRN

## 2012-04-16 MED ORDER — LACTATED RINGERS IV SOLN
INTRAVENOUS | Status: DC
  Administered 2012-04-17: 08:00:00 via INTRAVENOUS

## 2012-04-16 MED ORDER — OXYTOCIN 40 UNITS IN LACTATED RINGERS INFUSION - SIMPLE MED: 62.5000 mL/h | Freq: Once | INTRAVENOUS | Status: DC

## 2012-04-16 MED ORDER — OXYCODONE-ACETAMINOPHEN 5-325 MG PO TABS: 1.0000 | ORAL_TABLET | ORAL | Status: DC | PRN

## 2012-04-16 MED ORDER — OXYTOCIN BOLUS FROM INFUSION
250.0000 mL | Freq: Once | INTRAVENOUS | Status: DC
  Filled 2012-04-16: qty 500

## 2012-04-16 MED ORDER — LABETALOL HCL 5 MG/ML IV SOLN: 20.0000 mg | Freq: Once | INTRAVENOUS | Status: DC

## 2012-04-16 NOTE — H&P (Signed)
[name] is a 36 y.o. female presenting for IOL. Maternal Medical History:  Reason for admission: 36 yo G6 P1   EDC 04-13-12.  Previous C/S for breech.  Seen in office with elevated BP.  Sent to [facility] for further evaluation and BP continued to be elevated.  Fetal activity: Perceived fetal activity is normal.   Last perceived fetal movement was within the past hour.    Prenatal complications: no prenatal complications   OB History    Grav Para Term Preterm Abortions TAB SAB Ect Mult Living   6 1 1  0 4 0 4 0 0 1     Past Medical History  Diagnosis Date  . Beta thalassemia trait   . No pertinent past medical history    Past Surgical History  Procedure Date  . Cesarean section    Family History: family history includes Hypertension in her mother and Sickle cell anemia in her brother. Social History:  reports that she has never smoked. She does not have any smokeless tobacco history on file. She reports that she does not drink alcohol or use illicit drugs.   Prenatal Transfer Tool  Maternal Diabetes: No Genetic Screening: Normal Maternal Ultrasounds/Referrals: Normal Fetal Ultrasounds or other Referrals:  None Maternal Substance Abuse:  No Significant Maternal Medications:  Meds include: Other: see prenatal record Significant Maternal Lab Results:  Lab values include: Group B Strep negative Other Comments:  None  Review of Systems  All other systems reviewed and are negative.    Dilation: Fingertip Effacement (%): 50 Exam by:: Ginger Morris RN Blood pressure 141/102, pulse 96, temperature 97.3 F (36.3 C), temperature source Oral, resp. rate 18, height 5\' 5"  (1.651 m), weight 106.142 kg (234 lb), last menstrual period 07/08/2011. Maternal Exam:  Abdomen: Patient reports no abdominal tenderness. Surgical scars: low transverse.   Fetal presentation: vertex  Introitus: Normal vulva. Normal vagina.  Pelvis: questionable for delivery.   Cervix: Cervix evaluated by  digital exam.     Physical Exam  Nursing note and vitals reviewed. Constitutional: She is oriented to person, place, and time. She appears well-developed and well-nourished.  HENT:  Head: Normocephalic and atraumatic.  Eyes: Conjunctivae are normal. Pupils are equal, round, and reactive to light.  Neck: Normal range of motion. Neck supple.  Cardiovascular: Normal rate and regular rhythm.   Respiratory: Effort normal.  GI: Soft.  Genitourinary: Vagina normal and uterus normal.  Musculoskeletal: Normal range of motion.  Neurological: She is alert and oriented to person, place, and time.  Skin: Skin is warm and dry.  Psychiatric: She has a normal mood and affect. Her behavior is normal. Judgment and thought content normal.    Prenatal labs: ABO, Rh: --/--/O POS (08/12 1145) Antibody: PENDING (08/12 1145) Rubella: Immune (01/29 0000) RPR: Nonreactive (04/24 0000)  HBsAg: Negative (01/29 0000)  HIV: Non-reactive (04/24 0000)  GBS: Negative (07/15 0000)   Assessment/Plan: 40 weeks.  Increased BP.  IOL.  VBAC.   Keshon Markovitz A 04/16/2012, 2:51 PM

## 2012-04-16 NOTE — MAU Note (Signed)
Spoke with Dr Clearance Coots with update on pt's status, orders received

## 2012-04-16 NOTE — Anesthesia Preprocedure Evaluation (Addendum)
Anesthesia Evaluation  Patient identified by MRN, date of birth, ID band Patient awake    Reviewed: Allergy & Precautions, H&P , Patient's Chart, lab work & pertinent test results  Airway Mallampati: II TM Distance: >3 FB Neck ROM: Full    Dental No notable dental hx. (+) Teeth Intact   Pulmonary neg pulmonary ROS,  breath sounds clear to auscultation  Pulmonary exam normal       Cardiovascular negative cardio ROS  Rhythm:Regular Rate:Normal     Neuro/Psych negative neurological ROS  negative psych ROS   GI/Hepatic Neg liver ROS, GERD-  Medicated and Controlled,  Endo/Other  Morbid obesity  Renal/GU negative Renal ROS  negative genitourinary   Musculoskeletal negative musculoskeletal ROS (+)   Abdominal (+) + obese,   Peds  Hematology Beta Thalassemia trait    Anesthesia Other Findings   Reproductive/Obstetrics (+) Pregnancy                          Anesthesia Physical Anesthesia Plan  ASA: III  Anesthesia Plan: Epidural   Post-op Pain Management:    Induction:   Airway Management Planned: Natural Airway  Additional Equipment:   Intra-op Plan:   Post-operative Plan:   Informed Consent: I have reviewed the patients History and Physical, chart, labs and discussed the procedure including the risks, benefits and alternatives for the proposed anesthesia with the patient or authorized representative who has indicated his/her understanding and acceptance.     Plan Discussed with: CRNA, Anesthesiologist and Surgeon  Anesthesia Plan Comments:         Anesthesia Quick Evaluation

## 2012-04-16 NOTE — MAU Note (Signed)
Pt was sent over from Dr Verdell Carmine office for [facility] labs and fetal monitoring

## 2012-04-16 NOTE — MAU Provider Note (Signed)
History     CSN: [id number]  Arrival date and time: 04/16/12 1110   First Provider Initiated Contact with Patient 04/16/12 1223      Chief Complaint  Patient presents with  . Hypertension   HPI [name] is a 36 y.o. female @ [redacted]w[redacted]d gestation who presents to MAU with elevated blood pressure. She was evaluated in the office and sent to MAU for serial blood pressures and PIH labs. Went in for regular prenatal visit today and was complaining of headache and difficulty sleeping.  The history was provided by the patient.  OB History    Grav Para Term Preterm Abortions TAB SAB Ect Mult Living   6 1 1  0 4 0 4 0 0 1      Past Medical History  Diagnosis Date  . Beta thalassemia trait   . No pertinent past medical history     Past Surgical History  Procedure Date  . Cesarean section     Family History  Problem Relation Age of Onset  . Sickle cell anemia Brother   . Hypertension Mother     History  Substance Use Topics  . Smoking status: Never Smoker   . Smokeless tobacco: Not on file  . Alcohol Use: No    Allergies:  Allergies  Allergen Reactions  . Amoxicillin Rash    Prescriptions prior to admission  Medication Sig Dispense Refill  . acetaminophen (TYLENOL) 500 MG tablet Take 500 mg by mouth every 6 (six) hours as needed. For pain.      Marland Kitchen loratadine (CLARITIN) 10 MG tablet Take 10 mg by mouth daily as needed. For cough/allergies      . omeprazole (PRILOSEC) 20 MG capsule Take 20 mg by mouth every morning.      . ondansetron (ZOFRAN-ODT) 8 MG disintegrating tablet Take 8 mg by mouth every 8 (eight) hours as needed. For nausea and vomiting      . Pyridoxine HCl (VITAMIN B-6) 25 MG tablet Take 25 mg by mouth daily.        Review of Systems  Constitutional: Negative for fever, chills and weight loss.  HENT: Positive for congestion. Negative for ear pain, nosebleeds, sore throat and neck pain.   Eyes: Negative for blurred vision, double vision, photophobia  and pain.  Respiratory: Positive for cough. Negative for shortness of breath and wheezing.   Cardiovascular: Negative for chest pain, palpitations and leg swelling.  Gastrointestinal: Positive for heartburn, nausea and constipation. Negative for vomiting, abdominal pain and diarrhea.  Genitourinary: Positive for frequency. Negative for dysuria and urgency.  Musculoskeletal: Positive for back pain. Negative for myalgias.  Skin: Negative for itching and rash.  Neurological: Positive for headaches. Negative for dizziness, sensory change, speech change, seizures and weakness.  Endo/Heme/Allergies: Does not bruise/bleed easily.  Psychiatric/Behavioral: Negative for depression. The patient has insomnia. The patient is not nervous/anxious.    Physical Exam   Blood pressure 147/94, pulse 82, temperature 97.3 F (36.3 C), temperature source Oral, resp. rate 18, height 5\' 5"  (1.651 m), weight 234 lb (106.142 kg), last menstrual period 07/08/2011.  Physical Exam  Nursing note and vitals reviewed. Constitutional: She is oriented to person, place, and time. She appears well-developed and well-nourished. No distress.  HENT:  Head: Normocephalic and atraumatic.  Eyes: EOM are normal.  Neck: Neck supple.  Cardiovascular: Normal rate.   Respiratory: Effort normal.  GI: Soft. There is no tenderness.       gravid  Genitourinary:  Dilation: Fingertip Effacement (%): 50 Cervical Position: Middle Exam by:: Ginger Morris RN  Musculoskeletal: Normal range of motion.  Neurological: She is alert and oriented to person, place, and time.  Skin: Skin is warm and dry.  Psychiatric: She has a normal mood and affect. Her behavior is normal. Judgment and thought content normal.   Results for orders placed during the hospital encounter of 04/16/12 (from the past 24 hour(s))  URINALYSIS, ROUTINE W REFLEX MICROSCOPIC     Status: Normal   Collection Time   04/16/12 11:30 AM      Component Value Range    Color, Urine YELLOW  YELLOW   APPearance CLEAR  CLEAR   Specific Gravity, Urine 1.010  1.005 - 1.030   pH 7.5  5.0 - 8.0   Glucose, UA NEGATIVE  NEGATIVE mg/dL   Hgb urine dipstick NEGATIVE  NEGATIVE   Bilirubin Urine NEGATIVE  NEGATIVE   Ketones, ur NEGATIVE  NEGATIVE mg/dL   Protein, ur NEGATIVE  NEGATIVE mg/dL   Urobilinogen, UA 0.2  0.0 - 1.0 mg/dL   Nitrite NEGATIVE  NEGATIVE   Leukocytes, UA NEGATIVE  NEGATIVE  LACTATE DEHYDROGENASE     Status: Normal   Collection Time   04/16/12 11:45 AM      Component Value Range   LDH 151  94 - 250 U/L  CBC WITH DIFFERENTIAL     Status: Abnormal   Collection Time   04/16/12 11:45 AM      Component Value Range   WBC 5.7  4.0 - 10.5 K/uL   RBC 4.65  3.87 - 5.11 MIL/uL   Hemoglobin 9.6 (*) 12.0 - 15.0 g/dL   HCT 52.8 (*) 41.3 - 24.4 %   MCV 65.4 (*) 78.0 - 100.0 fL   MCH 20.6 (*) 26.0 - 34.0 pg   MCHC 31.6  30.0 - 36.0 g/dL   RDW 01.0 (*) 27.2 - 53.6 %   Platelets 124 (*) 150 - 400 K/uL   Neutrophils Relative 68  43 - 77 %   Neutro Abs 3.9  1.7 - 7.7 K/uL   Lymphocytes Relative 25  12 - 46 %   Lymphs Abs 1.4  0.7 - 4.0 K/uL   Monocytes Relative 6  3 - 12 %   Monocytes Absolute 0.3  0.1 - 1.0 K/uL   Eosinophils Relative 2  0 - 5 %   Eosinophils Absolute 0.1  0.0 - 0.7 K/uL   Basophils Relative 0  0 - 1 %   Basophils Absolute 0.0  0.0 - 0.1 K/uL  COMPREHENSIVE METABOLIC PANEL     Status: Abnormal   Collection Time   04/16/12 11:45 AM      Component Value Range   Sodium 135  135 - 145 mEq/L   Potassium 3.9  3.5 - 5.1 mEq/L   Chloride 103  96 - 112 mEq/L   CO2 21  19 - 32 mEq/L   Glucose, Bld 78  70 - 99 mg/dL   BUN 7  6 - 23 mg/dL   Creatinine, Ser 6.44  0.50 - 1.10 mg/dL   Calcium 8.9  8.4 - 03.4 mg/dL   Total Protein 6.1  6.0 - 8.3 g/dL   Albumin 2.7 (*) 3.5 - 5.2 g/dL   AST 18  0 - 37 U/L   ALT 12  0 - 35 U/L   Alkaline Phosphatase 111  39 - 117 U/L   Total Bilirubin 0.3  0.3 - 1.2 mg/dL   GFR  calc non Af Amer >90  >90  mL/min   GFR calc Af Amer >90  >90 mL/min  URIC ACID     Status: Normal   Collection Time   04/16/12 11:45 AM      Component Value Range   Uric Acid, Serum 4.6  2.4 - 7.0 mg/dL   Assessment:  36 y.o. female @ [redacted]w[redacted]d with headache   PIH  Plan:  Admit MAU Course: Discussed with Dr. Clearance Coots @ 13:12 and he will write admission orders.  Procedures  NEESE,HOPE, RN, FNP, [facility] 04/16/2012, 1:08 PM

## 2012-04-17 ENCOUNTER — Encounter (HOSPITAL_COMMUNITY): Payer: Self-pay | Admitting: *Deleted

## 2012-04-17 ENCOUNTER — Encounter (HOSPITAL_COMMUNITY): Payer: Self-pay | Admitting: Anesthesiology

## 2012-04-17 ENCOUNTER — Inpatient Hospital Stay (HOSPITAL_COMMUNITY): Payer: BC Managed Care – PPO | Admitting: Anesthesiology

## 2012-04-17 ENCOUNTER — Encounter (HOSPITAL_COMMUNITY)
Admission: AD | Disposition: A | Discharge status: Home / Self Care | Payer: Self-pay | Source: Ambulatory Visit | Attending: Obstetrics

## 2012-04-17 LAB — CBC
HCT: 30.3 % — ABNORMAL LOW (ref 36.0–46.0)
Platelets: 118 10*3/uL — ABNORMAL LOW (ref 150–400)
RBC: 4.63 MIL/uL (ref 3.87–5.11)
RDW: 18.4 % — ABNORMAL HIGH (ref 11.5–15.5)
WBC: 6.5 10*3/uL (ref 4.0–10.5)

## 2012-04-17 LAB — RPR: RPR Ser Ql: NONREACTIVE

## 2012-04-17 SURGERY — Surgical Case
Anesthesia: Spinal | Site: Abdomen | Wound class: Clean Contaminated

## 2012-04-17 MED ORDER — ONDANSETRON HCL 4 MG/2ML IJ SOLN
4.0000 mg | INTRAMUSCULAR | Status: DC | PRN
  Administered 2012-04-17: 4 mg via INTRAVENOUS

## 2012-04-17 MED ORDER — ONDANSETRON HCL 4 MG/2ML IJ SOLN
4.0000 mg | Freq: Three times a day (TID) | INTRAMUSCULAR | Status: DC | PRN
  Filled 2012-04-17: qty 2

## 2012-04-17 MED ORDER — ZOLPIDEM TARTRATE 5 MG PO TABS: 5.0000 mg | ORAL_TABLET | Freq: Every evening | ORAL | Status: DC | PRN

## 2012-04-17 MED ORDER — TETANUS-DIPHTH-ACELL PERTUSSIS 5-2.5-18.5 LF-MCG/0.5 IM SUSP
0.5000 mL | Freq: Once | INTRAMUSCULAR | Status: AC
  Administered 2012-04-18: 0.5 mL via INTRAMUSCULAR
  Filled 2012-04-17: qty 0.5

## 2012-04-17 MED ORDER — SODIUM CHLORIDE 0.9 % IJ SOLN: 3.0000 mL | INTRAMUSCULAR | Status: DC | PRN

## 2012-04-17 MED ORDER — ONDANSETRON HCL 4 MG/2ML IJ SOLN
INTRAMUSCULAR | Status: AC
  Filled 2012-04-17: qty 2

## 2012-04-17 MED ORDER — IBUPROFEN 600 MG PO TABS
600.0000 mg | ORAL_TABLET | Freq: Four times a day (QID) | ORAL | Status: DC
Start: 2012-04-17 — End: 2012-04-20
  Administered 2012-04-17 – 2012-04-20 (×12): 600 mg via ORAL
  Filled 2012-04-17 (×5): qty 1

## 2012-04-17 MED ORDER — OXYTOCIN 40 UNITS IN LACTATED RINGERS INFUSION - SIMPLE MED: 62.5000 mL/h | INTRAVENOUS | Status: AC

## 2012-04-17 MED ORDER — DIPHENHYDRAMINE HCL 25 MG PO CAPS: 25.0000 mg | ORAL_CAPSULE | ORAL | Status: DC | PRN

## 2012-04-17 MED ORDER — METOCLOPRAMIDE HCL 5 MG/ML IJ SOLN: 10.0000 mg | Freq: Three times a day (TID) | INTRAMUSCULAR | Status: DC | PRN

## 2012-04-17 MED ORDER — MEPERIDINE HCL 25 MG/ML IJ SOLN: 6.2500 mg | INTRAMUSCULAR | Status: DC | PRN

## 2012-04-17 MED ORDER — LACTATED RINGERS IV SOLN: INTRAVENOUS | Status: DC

## 2012-04-17 MED ORDER — ONDANSETRON HCL 4 MG/2ML IJ SOLN
INTRAMUSCULAR | Status: DC | PRN
  Administered 2012-04-17: 4 mg via INTRAVENOUS

## 2012-04-17 MED ORDER — KETOROLAC TROMETHAMINE 60 MG/2ML IM SOLN
60.0000 mg | Freq: Once | INTRAMUSCULAR | Status: AC | PRN
  Filled 2012-04-17: qty 2

## 2012-04-17 MED ORDER — KETOROLAC TROMETHAMINE 30 MG/ML IJ SOLN: 30.0000 mg | Freq: Four times a day (QID) | INTRAMUSCULAR | Status: AC | PRN

## 2012-04-17 MED ORDER — PHENYLEPHRINE HCL 10 MG/ML IJ SOLN
INTRAMUSCULAR | Status: DC | PRN
  Administered 2012-04-17 (×6): 40 ug via INTRAVENOUS

## 2012-04-17 MED ORDER — DIPHENHYDRAMINE HCL 50 MG/ML IJ SOLN: 12.5000 mg | INTRAMUSCULAR | Status: DC | PRN

## 2012-04-17 MED ORDER — DIPHENHYDRAMINE HCL 25 MG PO CAPS: 25.0000 mg | ORAL_CAPSULE | Freq: Four times a day (QID) | ORAL | Status: DC | PRN

## 2012-04-17 MED ORDER — SCOPOLAMINE 1 MG/3DAYS TD PT72
1.0000 | MEDICATED_PATCH | Freq: Once | TRANSDERMAL | Status: DC
  Administered 2012-04-17: 1.5 mg via TRANSDERMAL

## 2012-04-17 MED ORDER — PHENYLEPHRINE 40 MCG/ML (10ML) SYRINGE FOR IV PUSH (FOR BLOOD PRESSURE SUPPORT)
PREFILLED_SYRINGE | INTRAVENOUS | Status: AC
  Filled 2012-04-17: qty 15

## 2012-04-17 MED ORDER — ONDANSETRON HCL 4 MG PO TABS: 4.0000 mg | ORAL_TABLET | ORAL | Status: DC | PRN

## 2012-04-17 MED ORDER — SIMETHICONE 80 MG PO CHEW
80.0000 mg | CHEWABLE_TABLET | Freq: Three times a day (TID) | ORAL | Status: DC
  Administered 2012-04-17 – 2012-04-20 (×11): 80 mg via ORAL

## 2012-04-17 MED ORDER — SIMETHICONE 80 MG PO CHEW: 80.0000 mg | CHEWABLE_TABLET | ORAL | Status: DC | PRN

## 2012-04-17 MED ORDER — DIBUCAINE 1 % RE OINT: 1.0000 "application " | TOPICAL_OINTMENT | RECTAL | Status: DC | PRN

## 2012-04-17 MED ORDER — NALBUPHINE HCL 10 MG/ML IJ SOLN
5.0000 mg | INTRAMUSCULAR | Status: DC | PRN
  Administered 2012-04-17 – 2012-04-18 (×2): 10 mg via SUBCUTANEOUS
  Filled 2012-04-17 (×3): qty 1

## 2012-04-17 MED ORDER — CLINDAMYCIN PHOSPHATE 900 MG/50ML IV SOLN
900.0000 mg | Freq: Once | INTRAVENOUS | Status: DC
  Filled 2012-04-17: qty 50

## 2012-04-17 MED ORDER — NALBUPHINE HCL 10 MG/ML IJ SOLN
5.0000 mg | INTRAMUSCULAR | Status: DC | PRN
  Administered 2012-04-17: 10 mg via INTRAVENOUS
  Filled 2012-04-17 (×2): qty 1

## 2012-04-17 MED ORDER — HYDROMORPHONE HCL PF 1 MG/ML IJ SOLN: 0.2500 mg | INTRAMUSCULAR | Status: DC | PRN

## 2012-04-17 MED ORDER — OXYTOCIN 40 UNITS IN LACTATED RINGERS INFUSION - SIMPLE MED
INTRAVENOUS | Status: AC
  Filled 2012-04-17: qty 1000

## 2012-04-17 MED ORDER — OXYCODONE-ACETAMINOPHEN 5-325 MG PO TABS
1.0000 | ORAL_TABLET | ORAL | Status: DC | PRN
  Administered 2012-04-18: 2 via ORAL
  Administered 2012-04-18 – 2012-04-19 (×4): 1 via ORAL
  Filled 2012-04-17 (×3): qty 1
  Filled 2012-04-17: qty 2
  Filled 2012-04-17: qty 1

## 2012-04-17 MED ORDER — LANOLIN HYDROUS EX OINT: 1.0000 "application " | TOPICAL_OINTMENT | CUTANEOUS | Status: DC | PRN

## 2012-04-17 MED ORDER — NALOXONE HCL 0.4 MG/ML IJ SOLN: 0.4000 mg | INTRAMUSCULAR | Status: DC | PRN

## 2012-04-17 MED ORDER — FENTANYL CITRATE 0.05 MG/ML IJ SOLN
INTRAMUSCULAR | Status: AC
  Filled 2012-04-17: qty 2

## 2012-04-17 MED ORDER — SCOPOLAMINE 1 MG/3DAYS TD PT72
MEDICATED_PATCH | TRANSDERMAL | Status: AC
  Administered 2012-04-17: 1.5 mg via TRANSDERMAL
  Filled 2012-04-17: qty 1

## 2012-04-17 MED ORDER — SODIUM CHLORIDE 0.9 % IV SOLN: 1.0000 ug/kg/h | INTRAVENOUS | Status: DC | PRN

## 2012-04-17 MED ORDER — TERBUTALINE SULFATE 1 MG/ML IJ SOLN: 0.2500 mg | Freq: Once | INTRAMUSCULAR | Status: DC | PRN

## 2012-04-17 MED ORDER — CLINDAMYCIN PHOSPHATE 600 MG/50ML IV SOLN
INTRAVENOUS | Status: DC | PRN
  Administered 2012-04-17: 900 mg via INTRAVENOUS

## 2012-04-17 MED ORDER — MENTHOL 3 MG MT LOZG: 1.0000 | LOZENGE | OROMUCOSAL | Status: DC | PRN

## 2012-04-17 MED ORDER — MORPHINE SULFATE 0.5 MG/ML IJ SOLN
INTRAMUSCULAR | Status: AC
  Filled 2012-04-17: qty 10

## 2012-04-17 MED ORDER — OXYTOCIN 40 UNITS IN LACTATED RINGERS INFUSION - SIMPLE MED
1.0000 m[IU]/min | INTRAVENOUS | Status: DC
  Administered 2012-04-17: 1 m[IU]/min via INTRAVENOUS

## 2012-04-17 MED ORDER — DIPHENHYDRAMINE HCL 50 MG/ML IJ SOLN: 25.0000 mg | INTRAMUSCULAR | Status: DC | PRN

## 2012-04-17 MED ORDER — LACTATED RINGERS IV SOLN
INTRAVENOUS | Status: DC | PRN
  Administered 2012-04-17 (×3): via INTRAVENOUS

## 2012-04-17 MED ORDER — MORPHINE SULFATE 10 MG/ML IJ SOLN
INTRAMUSCULAR | Status: AC
  Filled 2012-04-17: qty 1

## 2012-04-17 MED ORDER — SENNOSIDES-DOCUSATE SODIUM 8.6-50 MG PO TABS
2.0000 | ORAL_TABLET | Freq: Every day | ORAL | Status: DC
Start: 2012-04-17 — End: 2012-04-20
  Administered 2012-04-17 – 2012-04-19 (×3): 2 via ORAL

## 2012-04-17 MED ORDER — OXYTOCIN 10 UNIT/ML IJ SOLN
INTRAMUSCULAR | Status: AC
  Filled 2012-04-17: qty 4

## 2012-04-17 MED ORDER — BUPIVACAINE HCL (PF) 0.25 % IJ SOLN
INTRAMUSCULAR | Status: DC | PRN
  Administered 2012-04-17: 30 mL

## 2012-04-17 MED ORDER — LACTATED RINGERS IV SOLN
INTRAVENOUS | Status: DC | PRN
  Administered 2012-04-17: 10:00:00 via INTRAVENOUS

## 2012-04-17 MED ORDER — PRENATAL MULTIVITAMIN CH
1.0000 | ORAL_TABLET | Freq: Every day | ORAL | Status: DC
  Administered 2012-04-18 – 2012-04-20 (×3): 1 via ORAL
  Filled 2012-04-17 (×3): qty 1

## 2012-04-17 MED ORDER — IBUPROFEN 600 MG PO TABS
600.0000 mg | ORAL_TABLET | Freq: Four times a day (QID) | ORAL | Status: DC | PRN
  Filled 2012-04-17 (×7): qty 1

## 2012-04-17 MED ORDER — OXYTOCIN 10 UNIT/ML IJ SOLN
40.0000 [IU] | INTRAMUSCULAR | Status: DC | PRN
  Administered 2012-04-17: 40 [IU] via INTRAVENOUS

## 2012-04-17 MED ORDER — WITCH HAZEL-GLYCERIN EX PADS: 1.0000 "application " | MEDICATED_PAD | CUTANEOUS | Status: DC | PRN

## 2012-04-17 SURGICAL SUPPLY — 53 items
ADH SKN CLS APL DERMABOND .7 (GAUZE/BANDAGES/DRESSINGS) ×1
CANISTER WOUND CARE 500ML ATS (WOUND CARE) IMPLANT
CHLORAPREP W/TINT 26ML (MISCELLANEOUS) ×2 IMPLANT
CLOTH BEACON ORANGE TIMEOUT ST (SAFETY) ×2 IMPLANT
CONTAINER PREFILL 10% NBF 15ML (MISCELLANEOUS) ×2 IMPLANT
DECANTER SPIKE VIAL GLASS SM (MISCELLANEOUS) ×1 IMPLANT
DERMABOND ADVANCED (GAUZE/BANDAGES/DRESSINGS) ×1
DERMABOND ADVANCED .7 DNX12 (GAUZE/BANDAGES/DRESSINGS) ×1 IMPLANT
DRSG COVADERM 4X10 (GAUZE/BANDAGES/DRESSINGS) ×1 IMPLANT
DRSG VAC ATS LRG SENSATRAC (GAUZE/BANDAGES/DRESSINGS) IMPLANT
DRSG VAC ATS MED SENSATRAC (GAUZE/BANDAGES/DRESSINGS) IMPLANT
DRSG VAC ATS SM SENSATRAC (GAUZE/BANDAGES/DRESSINGS) IMPLANT
ELECT REM PT RETURN 9FT ADLT (ELECTROSURGICAL) ×2
ELECTRODE REM PT RTRN 9FT ADLT (ELECTROSURGICAL) ×1 IMPLANT
EXTRACTOR VACUUM M CUP 4 TUBE (SUCTIONS) ×1 IMPLANT
GLOVE BIO SURGEON STRL SZ8 (GLOVE) ×4 IMPLANT
GLOVE BIOGEL PI IND STRL 6.5 (GLOVE) IMPLANT
GLOVE BIOGEL PI IND STRL 7.0 (GLOVE) IMPLANT
GLOVE BIOGEL PI INDICATOR 6.5 (GLOVE) ×1
GLOVE BIOGEL PI INDICATOR 7.0 (GLOVE) ×2
GLOVE ECLIPSE 6.5 STRL STRAW (GLOVE) ×1 IMPLANT
GLOVE NEODERM STER SZ 7 (GLOVE) ×1 IMPLANT
GOWN PREVENTION PLUS LG XLONG (DISPOSABLE) ×5 IMPLANT
GOWN PREVENTION PLUS XLARGE (GOWN DISPOSABLE) ×1 IMPLANT
KIT ABG SYR 3ML LUER SLIP (SYRINGE) IMPLANT
NDL HYPO 25X1 1.5 SAFETY (NEEDLE) IMPLANT
NDL HYPO 25X5/8 SAFETYGLIDE (NEEDLE) ×1 IMPLANT
NEEDLE HYPO 25X1 1.5 SAFETY (NEEDLE) ×2 IMPLANT
NEEDLE HYPO 25X5/8 SAFETYGLIDE (NEEDLE) ×2 IMPLANT
NS IRRIG 1000ML POUR BTL (IV SOLUTION) ×3 IMPLANT
PACK C SECTION WH (CUSTOM PROCEDURE TRAY) ×2 IMPLANT
PAD OB MATERNITY 4.3X12.25 (PERSONAL CARE ITEMS) ×1 IMPLANT
RTRCTR C-SECT PINK 25CM LRG (MISCELLANEOUS) ×2 IMPLANT
SLEEVE SCD COMPRESS KNEE MED (MISCELLANEOUS) IMPLANT
STAPLER VISISTAT 35W (STAPLE) ×1 IMPLANT
SUT GUT PLAIN 0 CT-3 TAN 27 (SUTURE) IMPLANT
SUT MNCRL 0 VIOLET CTX 36 (SUTURE) ×3 IMPLANT
SUT MNCRL AB 4-0 PS2 18 (SUTURE) IMPLANT
SUT MON AB 2-0 CT1 27 (SUTURE) ×2 IMPLANT
SUT MON AB 3-0 SH 27 (SUTURE)
SUT MON AB 3-0 SH27 (SUTURE) IMPLANT
SUT MONOCRYL 0 CTX 36 (SUTURE) ×3
SUT PDS AB 0 CTX 60 (SUTURE) IMPLANT
SUT PLAIN 2 0 XLH (SUTURE) IMPLANT
SUT VIC AB 0 CTX 36 (SUTURE) ×4
SUT VIC AB 0 CTX36XBRD ANBCTRL (SUTURE) IMPLANT
SUT VIC AB 2-0 CT1 27 (SUTURE)
SUT VIC AB 2-0 CT1 TAPERPNT 27 (SUTURE) IMPLANT
SYR CONTROL 10ML LL (SYRINGE) ×1 IMPLANT
TOWEL OR 17X24 6PK STRL BLUE (TOWEL DISPOSABLE) ×4 IMPLANT
TRAY FOLEY CATH 14FR (SET/KITS/TRAYS/PACK) ×2 IMPLANT
WATER STERILE IRR 1000ML POUR (IV SOLUTION) ×2 IMPLANT
YANKAUER SUCT BULB TIP NO VENT (SUCTIONS) ×1 IMPLANT

## 2012-04-17 NOTE — Progress Notes (Signed)
Catelyn A Willig is a 36 y.o. [id number] at [redacted]w[redacted]d by LMP admitted for induction of labor due to Post dates. Due date 04-13-12.  Subjective:   Objective: BP 124/73  Pulse 68  Temp 98.4 F (36.9 C) (Oral)  Resp 18  Ht 5\' 5"  (1.651 m)  Wt 106.142 kg (234 lb)  BMI 38.94 kg/m2  LMP 07/08/2011      FHT:  FHR: 150 bpm, variability: moderate,  accelerations:  Present,  decelerations:  Absent UC:   irregular SVE:   Dilation: 3 Effacement (%): Thick Station: -3 Exam by:: LCarpenter,RN  Labs: Lab Results  Component Value Date   WBC 5.7 04/16/2012   HGB 9.6* 04/16/2012   HCT 30.4* 04/16/2012   MCV 65.4* 04/16/2012   PLT 124* 04/16/2012    Assessment / Plan: Postdates.  Previous C/S.  IOL with foley bulb and pitocin unsuccessful.  Patient requesting repeat C/S.  Request granted.  Labor: latent phase Preeclampsia:  n/a Fetal Wellbeing:  Category I Pain Control:  Labor support without medications I/D:  n/a Anticipated MOD:  C/S  Javarion Douty A 04/17/2012, 8:50 AM

## 2012-04-17 NOTE — Transfer of Care (Signed)
Immediate Anesthesia Transfer of Care Note  Patient: [name]  Procedure(s) Performed: Procedure(s) (LRB): CESAREAN SECTION (N/A)  Patient Location: PACU  Anesthesia Type: Spinal  Level of Consciousness: awake, alert  and oriented  Airway & Oxygen Therapy: Patient Spontanous Breathing  Post-op Assessment: Report given to PACU RN  Post vital signs: Reviewed and stable  Complications: No apparent anesthesia complications

## 2012-04-17 NOTE — Op Note (Signed)
Cesarean Section Procedure Note   [name]   04/16/2012 - 04/17/2012  Indications: Scheduled Proceedure/Maternal Request   Pre-operative Diagnosis: repeat cesarean section.   Post-operative Diagnosis: Same   Surgeon: Fendi Meinhardt A  Assistants: Surgical Technician  Anesthesia: spinal  Procedure Details:  The patient was seen in the Holding Room. The risks, benefits, complications, treatment options, and expected outcomes were discussed with the patient. The patient concurred with the proposed plan, giving informed consent. The patient was identified as [name] and the procedure verified as C-Section Delivery. A Time Out was held and the above information confirmed.  After induction of anesthesia, the patient was draped and prepped in the usual sterile manner. A transverse incision was made and carried down through the subcutaneous tissue to the fascia. The fascial incision was made and extended transversely. The fascia was separated from the underlying rectus tissue superiorly and inferiorly. The peritoneum was identified and entered. The peritoneal incision was extended longitudinally. The utero-vesical peritoneal reflection was incised transversely and the bladder flap was bluntly freed from the lower uterine segment. A low transverse uterine incision was made. Delivered from cephalic presentation was a 4135 gram living newborn female infant(s) with Apgar scores of 9 at one minute and 9 at five minutes. A cord ph was not sent. The umbilical cord was clamped and cut cord. A sample was obtained for evaluation. The placenta was removed Intact and appeared normal.  The uterine incision was closed with running locked sutures of 0 Monocryl. A second imbricating layer of the same suture was placed.  Hemostasis was observed. The paracolic gutters were irrigated.  The peritoneum was closed with running suture of 2-0 Monocryl.  The fascia was then reapproximated with running sutures  of 0 Vicryl. The skin was closed with staples.  Instrument, sponge, and needle counts were correct prior the abdominal closure and were correct at the conclusion of the case.    Findings: Normal uterus, ovaries and tubes   Estimated Blood Loss:   Total IV Fluids:  Urine Output: 100CC OF clear urine  Specimens: placenta  Complications: no complications  Disposition: PACU - hemodynamically stable.  Maternal Condition: stable   Baby condition / location:  nursery-stable    Signed: Surgeon(s): Brock Bad, MD

## 2012-04-17 NOTE — Anesthesia Procedure Notes (Signed)

## 2012-04-17 NOTE — Addendum Note (Signed)
Addendum  created 04/17/12 1703 by Algis Greenhouse, CRNA   Modules edited:Notes Section

## 2012-04-17 NOTE — Anesthesia Postprocedure Evaluation (Signed)
  Anesthesia Post-op Note  Patient: [name]  Procedure(s) Performed: Procedure(s) (LRB): CESAREAN SECTION (N/A)  Patient Location: PACU  Anesthesia Type: Spinal  Level of Consciousness: awake, alert  and oriented  Airway and Oxygen Therapy: Patient Spontanous Breathing  Post-op Pain: none  Post-op Assessment: Post-op Vital signs reviewed, Patient's Cardiovascular Status Stable, Respiratory Function Stable, Patent Airway, No signs of Nausea or vomiting, Pain level controlled, No headache and No backache  Post-op Vital Signs: Reviewed and stable  Complications: No apparent anesthesia complications

## 2012-04-17 NOTE — Transfer of Care (Signed)
Anesthesia Post Note  Patient: [name]  Procedure(s) Performed: Procedure(s) (LRB): CESAREAN SECTION (N/A)  Anesthesia type: Spinal  Patient location: Mother/Baby  Post pain: Pain level controlled  Post assessment: Post-op Vital signs reviewed  Last Vitals:  Filed Vitals:   04/17/12 1545  BP: 132/92  Pulse: 65  Temp: 36.4 C  Resp: 18    Post vital signs: Reviewed  Level of consciousness: awake  Complications: No apparent anesthesia complications

## 2012-04-18 ENCOUNTER — Encounter (HOSPITAL_COMMUNITY): Payer: Self-pay | Admitting: Obstetrics

## 2012-04-18 NOTE — Progress Notes (Signed)
Subjective: Postpartum Day 1: Cesarean Delivery Patient reports tolerating PO, + flatus and no problems voiding.    Objective: Vital signs in last 24 hours: Temp:  [97.4 F (36.3 C)-98.6 F (37 C)] 98.5 F (36.9 C) (08/14 0600) Pulse Rate:  [60-88] 80  (08/14 0600) Resp:  [1-19] 18  (08/14 0600) BP: (109-155)/(76-92) 138/89 mmHg (08/14 0600) SpO2:  [96 %-100 %] 99 % (08/14 0600)  Physical Exam:  General: alert and no distress Lochia: appropriate Uterine Fundus: firm Incision: healing well DVT Evaluation: No evidence of DVT seen on physical exam.   Basename 04/18/12 0450 04/17/12 0853  HGB 7.4* 9.6*  HCT 23.2* 30.3*    Assessment/Plan: Status post Cesarean section. Doing well postoperatively.  Continue current care.  HARPER,CHARLES A 04/18/2012, 8:49 AM

## 2012-04-19 LAB — CBC
Hemoglobin: 7.4 g/dL — ABNORMAL LOW (ref 12.0–15.0)
MCH: 21 pg — ABNORMAL LOW (ref 26.0–34.0)
MCHC: 31.9 g/dL (ref 30.0–36.0)
MCV: 65.9 fL — ABNORMAL LOW (ref 78.0–100.0)
RBC: 3.52 MIL/uL — ABNORMAL LOW (ref 3.87–5.11)

## 2012-04-19 NOTE — Progress Notes (Signed)
Subjective: Postpartum Day 2: Cesarean Delivery Patient reports tolerating PO, + flatus and no problems voiding.    Objective: Vital signs in last 24 hours: Temp:  [98.1 F (36.7 C)-98.6 F (37 C)] 98.5 F (36.9 C) (08/15 0524) Pulse Rate:  [69-76] 69  (08/15 0524) Resp:  [17-18] 18  (08/15 0524) BP: (118-144)/(71-90) 118/77 mmHg (08/15 0524)  Physical Exam:  General: alert and no distress Lochia: appropriate Uterine Fundus: firm Incision: healing well DVT Evaluation: No evidence of DVT seen on physical exam.   Basename 04/18/12 0450 04/17/12 0853  HGB 7.4* 9.6*  HCT 23.2* 30.3*    Assessment/Plan: Status post Cesarean section. Doing well postoperatively.  Continue current care.  HARPER,CHARLES A 04/19/2012, 8:23 AM

## 2012-04-20 LAB — TYPE AND SCREEN: Unit division: 0

## 2012-04-20 MED ORDER — IBUPROFEN 600 MG PO TABS: 600.0000 mg | ORAL_TABLET | Freq: Four times a day (QID) | ORAL | Status: DC

## 2012-04-20 MED ORDER — OXYCODONE-ACETAMINOPHEN 5-325 MG PO TABS: 1.0000 | ORAL_TABLET | ORAL | Status: AC | PRN

## 2012-04-20 MED ORDER — FUSION PLUS PO CAPS: 1.0000 | ORAL_CAPSULE | Freq: Every day | ORAL | Status: DC

## 2012-04-20 NOTE — Progress Notes (Signed)
Dr. Clearance Coots notified that there was a small amount of bleeding at center of incision with staple removal. Order given to apply steri strip and send pt home. Pt made aware and will call office if she notices any further bleeding or problems.

## 2012-04-20 NOTE — Progress Notes (Signed)
Subjective: Postpartum Day 3: Cesarean Delivery Patient reports no complaints   Objective: Vital signs in last 24 hours: Temp:  [97.5 F (36.4 C)-98 F (36.7 C)] 97.5 F (36.4 C) (08/16 0525) Pulse Rate:  [67-85] 67  (08/16 0525) Resp:  [18-20] 18  (08/16 0525) BP: (122-134)/(73-82) 124/82 mmHg (08/16 0525)  Physical Exam:  General: alert and no distress Lochia: appropriate Uterine Fundus: firm Incision: healing well DVT Evaluation: No evidence of DVT seen on physical exam.   Basename 04/18/12 0450 04/17/12 0853  HGB 7.4* 9.6*  HCT 23.2* 30.3*    Assessment/Plan: Status post Cesarean section. Doing well postoperatively.  Discharge home with standard precautions and return to clinic in 2 weeks.  Haliey Romberg A 04/20/2012, 8:51 AM

## 2012-04-20 NOTE — Discharge Summary (Signed)
Obstetric Discharge Summary Reason for Admission: induction of labor and PIH Prenatal Procedures: ultrasound Intrapartum Procedures: Repeat LTCS Postpartum Procedures: none Complications-Operative and Postpartum: none HGB  Date Value Range Status  12/30/2010 11.1* 11.6 - 15.9 g/dL Final     Hemoglobin  Date Value Range Status  04/18/2012 7.4* 12.0 - 15.0 g/dL Final     DELTA CHECK NOTED     REPEATED TO VERIFY     HCT  Date Value Range Status  04/18/2012 23.2* 36.0 - 46.0 % Final  12/30/2010 35.4  34.8 - 46.6 % Final    Physical Exam:  General: alert and no distress Lochia: appropriate Uterine Fundus: firm Incision: healing well DVT Evaluation: No evidence of DVT seen on physical exam.  Discharge Diagnoses: Term Pregnancy-delivered and PIH  Discharge Information: Date: 04/20/2012 Activity: pelvic rest Diet: routine Medications: PNV, Ibuprofen, Colace, Iron and Percocet Condition: stable Instructions: refer to practice specific booklet Discharge to: home Follow-up Information    Follow up with HARPER,CHARLES A, MD. Schedule an appointment as soon as possible for a visit in 2 weeks.   Contact information:   [address] [place] [address] [phone]          Newborn Data: Live born female  Birth Weight: 9 lb 1.9 oz (4135 g) APGAR: 9, 9  Home with mother.  HARPER,CHARLES A 04/20/2012, 9:01 AM

## 2012-04-20 NOTE — Discharge Summary (Signed)
Obstetric Discharge Summary Reason for Admission: onset of labor Prenatal Procedures: none Intrapartum Procedures: cesarean: low cervical, transverse Postpartum Procedures: none Complications-Operative and Postpartum: none HGB  Date Value Range Status  12/30/2010 11.1* 11.6 - 15.9 g/dL Final     Hemoglobin  Date Value Range Status  04/18/2012 7.4* 12.0 - 15.0 g/dL Final     DELTA CHECK NOTED     REPEATED TO VERIFY     HCT  Date Value Range Status  04/18/2012 23.2* 36.0 - 46.0 % Final  12/30/2010 35.4  34.8 - 46.6 % Final    Physical Exam:  General: alert Lochia: appropriate Uterine Fundus: firm Incision: healing well DVT Evaluation: No evidence of DVT seen on physical exam.  Discharge Diagnoses: Term Pregnancy-delivered  Discharge Information: Date: 04/20/2012 Activity: pelvic rest Diet: routine Medications: Percocet Condition: stable Instructions: refer to practice specific booklet Discharge to: home Follow-up Information    Follow up with HARPER,CHARLES A, MD. Schedule an appointment as soon as possible for a visit in 2 weeks.   Contact information:   [address] [place] [address] [phone]          Newborn Data: Live born female  Birth Weight: 9 lb 1.9 oz (4135 g) APGAR: 9, 9  Home with mother.  Jisela Merlino A 04/20/2012, 1:12 PM

## 2012-06-12 DIAGNOSIS — I10 Essential (primary) hypertension: Secondary | ICD-10-CM

## 2012-06-12 HISTORY — DX: Essential (primary) hypertension: I10

## 2012-06-27 ENCOUNTER — Encounter: Payer: Self-pay | Admitting: *Deleted

## 2013-05-14 ENCOUNTER — Encounter: Payer: Self-pay | Admitting: Family Medicine

## 2014-07-07 ENCOUNTER — Encounter: Payer: Self-pay | Admitting: *Deleted

## 2014-07-11 ENCOUNTER — Encounter (HOSPITAL_COMMUNITY): Payer: Self-pay | Admitting: Emergency Medicine

## 2014-07-11 ENCOUNTER — Emergency Department (HOSPITAL_COMMUNITY)
Admission: EM | Admit: 2014-07-11 | Discharge: 2014-07-11 | Disposition: A | Discharge status: Home / Self Care | Payer: BC Managed Care – PPO | Attending: Emergency Medicine | Admitting: Emergency Medicine

## 2014-07-11 DIAGNOSIS — Y9389 Activity, other specified: Secondary | ICD-10-CM | POA: Insufficient documentation

## 2014-07-11 DIAGNOSIS — Z23 Encounter for immunization: Secondary | ICD-10-CM | POA: Insufficient documentation

## 2014-07-11 DIAGNOSIS — S61211A Laceration without foreign body of left index finger without damage to nail, initial encounter: Secondary | ICD-10-CM | POA: Insufficient documentation

## 2014-07-11 DIAGNOSIS — Z880 Allergy status to penicillin: Secondary | ICD-10-CM | POA: Insufficient documentation

## 2014-07-11 DIAGNOSIS — S61412A Laceration without foreign body of left hand, initial encounter: Secondary | ICD-10-CM

## 2014-07-11 DIAGNOSIS — R061 Stridor: Secondary | ICD-10-CM | POA: Insufficient documentation

## 2014-07-11 DIAGNOSIS — D563 Thalassemia minor: Secondary | ICD-10-CM | POA: Insufficient documentation

## 2014-07-11 DIAGNOSIS — Y9289 Other specified places as the place of occurrence of the external cause: Secondary | ICD-10-CM | POA: Insufficient documentation

## 2014-07-11 DIAGNOSIS — Z79899 Other long term (current) drug therapy: Secondary | ICD-10-CM | POA: Insufficient documentation

## 2014-07-11 DIAGNOSIS — I10 Essential (primary) hypertension: Secondary | ICD-10-CM | POA: Insufficient documentation

## 2014-07-11 DIAGNOSIS — W260XXA Contact with knife, initial encounter: Secondary | ICD-10-CM | POA: Insufficient documentation

## 2014-07-11 MED ORDER — TETANUS-DIPHTH-ACELL PERTUSSIS 5-2.5-18.5 LF-MCG/0.5 IM SUSP
0.5000 mL | Freq: Once | INTRAMUSCULAR | Status: AC
  Administered 2014-07-11: 0.5 mL via INTRAMUSCULAR
  Filled 2014-07-11: qty 0.5

## 2014-07-11 MED ORDER — LIDOCAINE HCL 1 % IJ SOLN
5.0000 mL | Freq: Once | INTRAMUSCULAR | Status: AC
  Administered 2014-07-11: 5 mL via INTRADERMAL

## 2014-07-11 NOTE — ED Notes (Signed)
Pt cut base of left index finger with knife today, wound approximately 2 cm long, minimal bleeding.

## 2014-07-11 NOTE — ED Provider Notes (Signed)
CSN: [id number]     Arrival date & time 07/11/14  1143 History  This chart was scribed for Renold Genta, working with Tanna Furry, MD found by Starleen Arms, ED Scribe. This patient was seen in room WTR6/WTR6 and the patient's care was started at 12:22 PM.   Chief Complaint  Patient presents with  . Extremity Laceration   The history is provided by the patient. No language interpreter was used.   HPI Comments: [name] is a 38 y.o. female who presents to the Emergency Department complaining of a linear laceration at the base of her left first finger sustained earlier today.  Patient reports she was sharpening a dull straight-edge knife when she cut her left hand.  She reports she applied a pressure bandage at home which controlled the bleeding.    Patient reports mild associated numbness in the tip of her 1st finger.  Last tetanus shot was 10 years ago.    Past Medical History  Diagnosis Date  . Beta thalassemia trait   . No pertinent past medical history   . Essential hypertension, benign 06/12/2012    The [facility] abstract note also started on two  new meds for this see med list.   Past Surgical History  Procedure Laterality Date  . Cesarean section    . Cesarean section  04/17/2012    Procedure: CESAREAN SECTION;  Surgeon: Shelly Bombard, MD;  Location: [facility];  Service: Gynecology;  Laterality: N/A;   Family History  Problem Relation Age of Onset  . Sickle cell anemia Brother   . Hypertension Mother    History  Substance Use Topics  . Smoking status: Never Smoker   . Smokeless tobacco: Not on file  . Alcohol Use: No   OB History    Gravida Para Term Preterm AB TAB SAB Ectopic Multiple Living   7 2 2  0 4 0 4 0 0 2     Review of Systems  Respiratory: Positive for stridor.   Skin: Positive for wound.      Allergies  Amoxicillin  Home Medications   Prior to Admission medications   Medication Sig Start Date End Date Taking?  Authorizing Provider  carvedilol (COREG) 12.5 MG tablet Take 12.5 mg by mouth 2 (two) times daily with a meal.    Historical Provider, MD  ibuprofen (ADVIL,MOTRIN) 600 MG tablet Take 1 tablet (600 mg total) by mouth every 6 (six) hours. 04/20/12 04/30/12  Shelly Bombard, MD  Iron-FA-B Cmp-C-Biot-Probiotic (FUSION PLUS) CAPS Take 1 capsule by mouth daily before breakfast. 04/20/12   Shelly Bombard, MD  loratadine (CLARITIN) 10 MG tablet Take 10 mg by mouth daily as needed. For cough/allergies    Historical Provider, MD  omeprazole (PRILOSEC) 20 MG capsule Take 20 mg by mouth every morning.    Historical Provider, MD  ondansetron (ZOFRAN-ODT) 8 MG disintegrating tablet Take 8 mg by mouth every 8 (eight) hours as needed. For nausea and vomiting    Historical Provider, MD  Pyridoxine HCl (VITAMIN B-6) 25 MG tablet Take 25 mg by mouth daily.    Historical Provider, MD  triamterene-hydrochlorothiazide (DYAZIDE) 37.5-25 MG per capsule Take 1 capsule by mouth every morning.    Historical Provider, MD   BP 149/88 mmHg  Pulse 96  Temp(Src) 98 F (36.7 C) (Oral)  Resp 18  SpO2 98% Physical Exam  Constitutional: She is oriented to person, place, and time. She appears well-developed and well-nourished. No distress.  HENT:  Head: Normocephalic and atraumatic.  Eyes: Conjunctivae and EOM are normal.  Neck: Neck supple. No tracheal deviation present.  Cardiovascular: Normal rate.   Pulmonary/Chest: Effort normal. No respiratory distress.  Musculoskeletal: Normal range of motion.  Full rom of all fingers of the right hand. Full flexion and extension of right index finger at all joints. Good strength against resistance with flexion and extension of each joint. Mild decrease in sensation over dorsal surface of distal finger. Cap refill <2sec.   Neurological: She is alert and oriented to person, place, and time.  Skin: Skin is warm and dry.  2cm laceration to the dorsal right hand just lateral to 2nd MCP  joint. Gaping. Hemostatic.   Psychiatric: She has a normal mood and affect. Her behavior is normal.  Nursing note and vitals reviewed.   ED Course  Procedures (including critical care time)  DIAGNOSTIC STUDIES: Oxygen Saturation is 98% on RA, normal by my interpretation.    COORDINATION OF CARE:  12:25 PM Will proved laceration repair.  Patient acknowledges and agrees with plan.    Labs Review Labs Reviewed - No data to display  Imaging Review No results found.   EKG Interpretation None      LACERATION REPAIR Performed by: Renold Genta Authorized by: Jeannett Senior A Consent: Verbal consent obtained. Risks and benefits: risks, benefits and alternatives were discussed Consent given by: patient Patient identity confirmed: provided demographic data Prepped and Draped in normal sterile fashion Wound explored  Laceration Location: right hand  Laceration Length: 2cm  No Foreign Bodies seen or palpated  Anesthesia: local infiltration  Local anesthetic: lidocaine 2% wo epinephrine  Anesthetic total: 2 ml  Irrigation method: syringe Amount of cleaning: standard  Skin closure: prolene 5.0  Number of sutures: 3  Technique: simple interrupted  Patient tolerance: Patient tolerated the procedure well with no immediate complications.  MDM   Final diagnoses:  Laceration of left hand, initial encounter    Pt with laceration of right hand. Does not appear to involve tendon, however, does have some decrease in sensation. Laceration repaired. Will discharge home with wound care. I will give her referral to hand specialist in case numbness persists or if she has any trouble moving her finger. Tetanus updated  Filed Vitals:   07/11/14 1158 07/11/14 1307  BP: 149/88 138/79  Pulse: 96 80  Temp: 98 F (36.7 C) 98.3 F (36.8 C)  TempSrc: Oral Oral  Resp: 18 18  SpO2: 98% 98%    I personally performed the services described in this documentation,  which was scribed in my presence. The recorded information has been reviewed and is accurate.   Renold Genta, PA-C 07/11/14 [place], MD 07/12/14 [phone]

## 2014-07-11 NOTE — Discharge Instructions (Signed)
Ibuprofen for pain. Keep laceration clean and dry. Bacitracin twice a day. Wash with soap and water. Follow up with hand specialist if continue to have numbness. Return or follow up for suture removal in 7 days.   Laceration Care, Adult A laceration is a cut or lesion that goes through all layers of the skin and into the tissue just beneath the skin. TREATMENT  Some lacerations may not require closure. Some lacerations may not be able to be closed due to an increased risk of infection. It is important to see your caregiver as soon as possible after an injury to minimize the risk of infection and maximize the opportunity for successful closure. If closure is appropriate, pain medicines may be given, if needed. The wound will be cleaned to help prevent infection. Your caregiver will use stitches (sutures), staples, wound glue (adhesive), or skin adhesive strips to repair the laceration. These tools bring the skin edges together to allow for faster healing and a better cosmetic outcome. However, all wounds will heal with a scar. Once the wound has healed, scarring can be minimized by covering the wound with sunscreen during the day for 1 full year. HOME CARE INSTRUCTIONS  For sutures or staples:  Keep the wound clean and dry.  If you were given a bandage (dressing), you should change it at least once a day. Also, change the dressing if it becomes wet or dirty, or as directed by your caregiver.  Wash the wound with soap and water 2 times a day. Rinse the wound off with water to remove all soap. Pat the wound dry with a clean towel.  After cleaning, apply a thin layer of the antibiotic ointment as recommended by your caregiver. This will help prevent infection and keep the dressing from sticking.  You may shower as usual after the first 24 hours. Do not soak the wound in water until the sutures are removed.  Only take over-the-counter or prescription medicines for pain, discomfort, or fever as  directed by your caregiver.  Get your sutures or staples removed as directed by your caregiver. For skin adhesive strips:  Keep the wound clean and dry.  Do not get the skin adhesive strips wet. You may bathe carefully, using caution to keep the wound dry.  If the wound gets wet, pat it dry with a clean towel.  Skin adhesive strips will fall off on their own. You may trim the strips as the wound heals. Do not remove skin adhesive strips that are still stuck to the wound. They will fall off in time. For wound adhesive:  You may briefly wet your wound in the shower or bath. Do not soak or scrub the wound. Do not swim. Avoid periods of heavy perspiration until the skin adhesive has fallen off on its own. After showering or bathing, gently pat the wound dry with a clean towel.  Do not apply liquid medicine, cream medicine, or ointment medicine to your wound while the skin adhesive is in place. This may loosen the film before your wound is healed.  If a dressing is placed over the wound, be careful not to apply tape directly over the skin adhesive. This may cause the adhesive to be pulled off before the wound is healed.  Avoid prolonged exposure to sunlight or tanning lamps while the skin adhesive is in place. Exposure to ultraviolet light in the first year will darken the scar.  The skin adhesive will usually remain in place for 5 to 10  days, then naturally fall off the skin. Do not pick at the adhesive film. You may need a tetanus shot if:  You cannot remember when you had your last tetanus shot.  You have never had a tetanus shot. If you get a tetanus shot, your arm may swell, get red, and feel warm to the touch. This is common and not a problem. If you need a tetanus shot and you choose not to have one, there is a rare chance of getting tetanus. Sickness from tetanus can be serious. SEEK MEDICAL CARE IF:   You have redness, swelling, or increasing pain in the wound.  You see a red  line that goes away from the wound.  You have yellowish-white fluid (pus) coming from the wound.  You have a fever.  You notice a bad smell coming from the wound or dressing.  Your wound breaks open before or after sutures have been removed.  You notice something coming out of the wound such as wood or glass.  Your wound is on your hand or foot and you cannot move a finger or toe. SEEK IMMEDIATE MEDICAL CARE IF:   Your pain is not controlled with prescribed medicine.  You have severe swelling around the wound causing pain and numbness or a change in color in your arm, hand, leg, or foot.  Your wound splits open and starts bleeding.  You have worsening numbness, weakness, or loss of function of any joint around or beyond the wound.  You develop painful lumps near the wound or on the skin anywhere on your body. MAKE SURE YOU:   Understand these instructions.  Will watch your condition.  Will get help right away if you are not doing well or get worse. Document Released: 08/22/2005 Document Revised: 11/14/2011 Document Reviewed: 02/15/2011 [facility] Patient Information 2015 [place], [place]. This information is not intended to replace advice given to you by your health care provider. Make sure you discuss any questions you have with your health care provider.

## 2014-07-18 ENCOUNTER — Encounter (HOSPITAL_COMMUNITY): Payer: Self-pay | Admitting: Emergency Medicine

## 2014-07-18 ENCOUNTER — Emergency Department (HOSPITAL_COMMUNITY)
Admission: EM | Admit: 2014-07-18 | Discharge: 2014-07-18 | Disposition: A | Discharge status: Home / Self Care | Payer: BC Managed Care – PPO | Attending: Emergency Medicine | Admitting: Emergency Medicine

## 2014-07-18 DIAGNOSIS — Z880 Allergy status to penicillin: Secondary | ICD-10-CM | POA: Insufficient documentation

## 2014-07-18 DIAGNOSIS — D563 Thalassemia minor: Secondary | ICD-10-CM | POA: Insufficient documentation

## 2014-07-18 DIAGNOSIS — Z79899 Other long term (current) drug therapy: Secondary | ICD-10-CM | POA: Insufficient documentation

## 2014-07-18 DIAGNOSIS — Z791 Long term (current) use of non-steroidal anti-inflammatories (NSAID): Secondary | ICD-10-CM | POA: Insufficient documentation

## 2014-07-18 DIAGNOSIS — I10 Essential (primary) hypertension: Secondary | ICD-10-CM | POA: Insufficient documentation

## 2014-07-18 DIAGNOSIS — Z4802 Encounter for removal of sutures: Secondary | ICD-10-CM | POA: Insufficient documentation

## 2014-07-18 DIAGNOSIS — R200 Anesthesia of skin: Secondary | ICD-10-CM | POA: Insufficient documentation

## 2014-07-18 NOTE — ED Notes (Signed)
Pt here to get stitches on her left hand removed that were placed here on 07/11/2014.

## 2014-07-18 NOTE — ED Provider Notes (Signed)
CSN: [id number]     Arrival date & time 07/18/14  0919 History   First MD Initiated Contact with Patient 07/18/14 [phone]     Chief Complaint  Patient presents with  . stitches removed      (Consider location/radiation/quality/duration/timing/severity/associated sxs/prior Treatment) HPI  [name] is a 38 y.o. female presenting for suture removal. Patient was here November 6 and had 3 stitches placed in her left index finger at the base. Patient denies fevers, chills, nausea, vomiting. No erythema purulent discharge swelling. Patient does note some persistent numbness in that hand. No weakness. She was given instructions to follow-up with hand surgery and has not yet.   Past Medical History  Diagnosis Date  . Beta thalassemia trait   . No pertinent past medical history   . Essential hypertension, benign 06/12/2012    [facility] abstract note also started on two  new meds for this see med list.   Past Surgical History  Procedure Laterality Date  . Cesarean section    . Cesarean section  04/17/2012    Procedure: CESAREAN SECTION;  Surgeon: Shelly Bombard, MD;  Location: [facility];  Service: Gynecology;  Laterality: N/A;   Family History  Problem Relation Age of Onset  . Sickle cell anemia Brother   . Hypertension Mother    History  Substance Use Topics  . Smoking status: Never Smoker   . Smokeless tobacco: Not on file  . Alcohol Use: No   OB History    Gravida Para Term Preterm AB TAB SAB Ectopic Multiple Living   7 2 2  0 4 0 4 0 0 2     Review of Systems  Constitutional: Negative for fever and chills.  Gastrointestinal: Negative for nausea and vomiting.  Skin: Positive for wound.      Allergies  Amoxicillin  Home Medications   Prior to Admission medications   Medication Sig Start Date End Date Taking? Authorizing Provider  carvedilol (COREG) 12.5 MG tablet Take 12.5 mg by mouth 2 (two) times daily with a meal.    Historical Provider, MD   ibuprofen (ADVIL,MOTRIN) 600 MG tablet Take 1 tablet (600 mg total) by mouth every 6 (six) hours. 04/20/12 04/30/12  Shelly Bombard, MD  Iron-FA-B Cmp-C-Biot-Probiotic (FUSION PLUS) CAPS Take 1 capsule by mouth daily before breakfast. 04/20/12   Shelly Bombard, MD  loratadine (CLARITIN) 10 MG tablet Take 10 mg by mouth daily as needed. For cough/allergies    Historical Provider, MD  omeprazole (PRILOSEC) 20 MG capsule Take 20 mg by mouth every morning.    Historical Provider, MD  ondansetron (ZOFRAN-ODT) 8 MG disintegrating tablet Take 8 mg by mouth every 8 (eight) hours as needed. For nausea and vomiting    Historical Provider, MD  Pyridoxine HCl (VITAMIN B-6) 25 MG tablet Take 25 mg by mouth daily.    Historical Provider, MD  triamterene-hydrochlorothiazide (DYAZIDE) 37.5-25 MG per capsule Take 1 capsule by mouth every morning.    Historical Provider, MD   BP 137/90 mmHg  Pulse 68  Temp(Src) 97.5 F (36.4 C) (Oral)  Resp 18  SpO2 100% Physical Exam  Constitutional: She appears well-developed and well-nourished. No distress.  HENT:  Head: Normocephalic and atraumatic.  Eyes: Conjunctivae are normal. Right eye exhibits no discharge. Left eye exhibits no discharge.  Pulmonary/Chest: Effort normal. No respiratory distress.  Neurological: She is alert. Coordination normal.  Skin: She is not diaphoretic.  2 cm well healing linear well approximated repaired  laceration to left dorsal hand to proximal left index finger. Patient with full range of motion of finger with intact extension and flexion against resistance. Mild decrease in sensation to distal index finger. 2+ radial pulses equal bilaterally. Cap refill less than 2 seconds.  Nursing note and vitals reviewed.   ED Course  Procedures (including critical care time) Labs Review Labs Reviewed - No data to display  Imaging Review No results found.   EKG Interpretation None      SUTURE REMOVAL Performed by: Pura Spice  Consent: Verbal consent obtained. Patient identity confirmed: provided demographic data Time out: Immediately prior to procedure a "time out" was called to verify the correct patient, procedure, equipment, support staff and site/side marked as required.  Location details: left dorsal index finger  Wound Appearance: clean  Sutures/Staples Removed: 3  Facility: sutures placed in this facility Patient tolerance: Patient tolerated the procedure well with no immediate complications.     MDM   Final diagnoses:  Visit for suture removal   Staple removal   Pt to ER for staple/suture removal and wound check as above. Procedure tolerated well. Vitals normal, no signs of infection. Scar minimization & return precautions given at dc. Patient to follow-up with hand surgeon for persistent numbness of the finger.   Discussed return precautions with patient. Discussed all results and patient verbalizes understanding and agrees with plan.      Pura Spice, PA-C 07/18/14 1010  Mariea Clonts, MD 07/18/14 [phone]

## 2014-07-18 NOTE — Discharge Instructions (Signed)
Return to the emergency room with worsening of symptoms, new symptoms or with symptoms that are concerning, as needed. Follow up with Dr. Amedeo Plenty due to persistent numbness. Call to make appointment.   Suture Removal, Care After Refer to this sheet in the next few weeks. These instructions provide you with information on caring for yourself after your procedure. Your health care provider may also give you more specific instructions. Your treatment has been planned according to current medical practices, but problems sometimes occur. Call your health care provider if you have any problems or questions after your procedure. WHAT TO EXPECT AFTER THE PROCEDURE After your stitches (sutures) are removed, it is typical to have the following:  Some discomfort and swelling in the wound area.  Slight redness in the area. HOME CARE INSTRUCTIONS   If you have skin adhesive strips over the wound area, do not take the strips off. They will fall off on their own in a few days. If the strips remain in place after 14 days, you may remove them.  Change any bandages (dressings) at least once a day or as directed by your health care provider. If the bandage sticks, soak it off with warm, soapy water.  Apply cream or ointment only as directed by your health care provider. If using cream or ointment, wash the area with soap and water 2 times a day to remove all the cream or ointment. Rinse off the soap and pat the area dry with a clean towel.  Keep the wound area dry and clean. If the bandage becomes wet or dirty, or if it develops a bad smell, change it as soon as possible.  Continue to protect the wound from injury.  Use sunscreen when out in the sun. New scars become sunburned easily. SEEK MEDICAL CARE IF:  You have increasing redness, swelling, or pain in the wound.  You see pus coming from the wound.  You have a fever.  You notice a bad smell coming from the wound or dressing.  Your wound breaks  open (edges not staying together). Document Released: 05/17/2001 Document Revised: 06/12/2013 Document Reviewed: 04/03/2013 [facility] Patient Information 2015 [place], [place]. This information is not intended to replace advice given to you by your health care provider. Make sure you discuss any questions you have with your health care provider.   Scar Minimization You will have a scar anytime you have surgery and a cut is made in the skin or you have something removed from your skin (mole, skin cancer, cyst). Although scars are unavoidable following surgery, there are ways to minimize their appearance. It is important to follow all the instructions you receive from your caregiver about wound care. How your wound heals will influence the appearance of your scar. If you do not follow the wound care instructions as directed, complications such as infection may occur. Wound instructions include keeping the wound clean, moist, and not letting the wound form a scab. Some people form scars that are raised and lumpy (hypertrophic) or larger than the initial wound (keloidal). HOME CARE INSTRUCTIONS   Follow wound care instructions as directed.  Keep the wound clean by washing it with soap and water.  Keep the wound moist with provided antibiotic cream or petroleum jelly until completely healed. Moisten twice a day for about 2 weeks.  Get stitches (sutures) taken out at the scheduled time.  Avoid touching or manipulating your wound unless needed. Wash your hands thoroughly before and after touching your wound.  Follow  all restrictions such as limits on exercise or work. This depends on where your scar is located.  Keep the scar protected from sunburn. Cover the scar with sunscreen/sunblock with SPF 30 or higher.  Gently massage the scar using a circular motion to help minimize the appearance of the scar. Do this only after the wound has closed and all the sutures have been removed.  For hypertrophic  or keloidal scars, there are several ways to treat and minimize their appearance. Methods include compression therapy, intralesional corticosteroids, laser therapy, or surgery. These methods are performed by your caregiver. Remember that the scar may appear lighter or darker than your normal skin color. This difference in color should even out with time. SEEK MEDICAL CARE IF:   You have a fever.  You develop signs of infection such as pain, redness, pus, and warmth.  You have questions or concerns. Document Released: 02/09/2010 Document Revised: 11/14/2011 Document Reviewed: 02/09/2010 [facility] Patient Information 2015 [place], [place]. This information is not intended to replace advice given to you by your health care provider. Make sure you discuss any questions you have with your health care provider.

## 2014-11-14 ENCOUNTER — Ambulatory Visit (INDEPENDENT_AMBULATORY_CARE_PROVIDER_SITE_OTHER): Payer: Self-pay | Admitting: Family Medicine

## 2014-11-14 VITALS — BP 130/90 | HR 99 | Temp 98.5°F | Resp 18 | Ht 66.0 in | Wt 213.0 lb

## 2014-11-14 DIAGNOSIS — R05 Cough: Secondary | ICD-10-CM

## 2014-11-14 DIAGNOSIS — R6889 Other general symptoms and signs: Secondary | ICD-10-CM

## 2014-11-14 DIAGNOSIS — R059 Cough, unspecified: Secondary | ICD-10-CM

## 2014-11-14 DIAGNOSIS — R52 Pain, unspecified: Secondary | ICD-10-CM

## 2014-11-14 LAB — POCT CBC
GRANULOCYTE PERCENT: 52.2 % (ref 37–80)
HCT, POC: 36.2 % — AB (ref 37.7–47.9)
HEMOGLOBIN: 10.8 g/dL — AB (ref 12.2–16.2)
Lymph, poc: 2.3 (ref 0.6–3.4)
MCH: 20.8 pg — AB (ref 27–31.2)
MCHC: 29.8 g/dL — AB (ref 31.8–35.4)
MCV: 69.8 fL — AB (ref 80–97)
MID (CBC): 0.5 (ref 0–0.9)
MPV: 8.6 fL (ref 0–99.8)
PLATELET COUNT, POC: 215 10*3/uL (ref 142–424)
POC Granulocyte: 3.1 (ref 2–6.9)
POC LYMPH PERCENT: 38.8 %L (ref 10–50)
POC MID %: 9 %M (ref 0–12)
RBC: 5.18 M/uL (ref 4.04–5.48)
RDW, POC: 16.8 %
WBC: 6 10*3/uL (ref 4.6–10.2)

## 2014-11-14 LAB — POCT INFLUENZA A/B
Influenza A, POC: NEGATIVE
Influenza B, POC: NEGATIVE

## 2014-11-14 MED ORDER — HYDROCODONE-HOMATROPINE 5-1.5 MG/5ML PO SYRP: 5.0000 mL | ORAL_SOLUTION | Freq: Three times a day (TID) | ORAL | Status: DC | PRN

## 2014-11-14 MED ORDER — OSELTAMIVIR PHOSPHATE 75 MG PO CAPS: 75.0000 mg | ORAL_CAPSULE | Freq: Two times a day (BID) | ORAL | Status: DC

## 2014-11-14 NOTE — Progress Notes (Signed)
[facility] [address] [place] [address] [phone]  Date:  11/14/2014   Name:  [name]   DOB:  17-Feb-1976   MRN:  [id number]  PCP:  No primary care provider on file.    Chief Complaint: Cough; Sore Throat; Nasal Congestion; Back Pain; and Blurred Vision   History of Present Illness:  [name] is a 39 y.o. very pleasant female patient who presents with the following:  She has been coughing for about 3 days, and has lost her voice some.  Her throat burns when she coughs.  She has seen a red spot on her left eye since yesterday; wonders if she injured a blood vessel from her cough.  Her back also hurts when she coughs She did have a fever up to 101.3 a couple of days ago, she is having chills, body aches No vomiting Her vision is actually ok  She is generally in good health  Patient Active Problem List   Diagnosis Date Noted  . 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 Elderly multigravida with antepartum condition or complication 58/30/9407  . Beta thalassemia trait 10/11/2011    Past Medical History  Diagnosis Date  . Beta thalassemia trait   . No pertinent past medical history   . Essential hypertension, benign 06/12/2012    [facility] abstract note also started on two  new meds for this see med list.    Past Surgical History  Procedure Laterality Date  . Cesarean section    . Cesarean section  04/17/2012    Procedure: CESAREAN SECTION;  Surgeon: Shelly Bombard, MD;  Location: [facility];  Service: Gynecology;  Laterality: N/A;    History  Substance Use Topics  . Smoking status: Never Smoker   . Smokeless tobacco: Not on file  . Alcohol Use: No    Family History  Problem Relation Age of Onset  . Sickle cell anemia Brother   . Hypertension Mother     Allergies  Allergen Reactions  . Amoxicillin Rash    Medication list has been reviewed and updated.  Current Outpatient Prescriptions on  File Prior to Visit  Medication Sig Dispense Refill  . ibuprofen (ADVIL,MOTRIN) 600 MG tablet Take 1 tablet (600 mg total) by mouth every 6 (six) hours. 30 tablet 5  . loratadine (CLARITIN) 10 MG tablet Take 10 mg by mouth daily as needed. For cough/allergies    . omeprazole (PRILOSEC) 20 MG capsule Take 20 mg by mouth every morning.    . carvedilol (COREG) 12.5 MG tablet Take 12.5 mg by mouth 2 (two) times daily with a meal.    . Iron-FA-B Cmp-C-Biot-Probiotic (FUSION PLUS) CAPS Take 1 capsule by mouth daily before breakfast. (Patient not taking: Reported on 11/14/2014) 30 capsule 5  . ondansetron (ZOFRAN-ODT) 8 MG disintegrating tablet Take 8 mg by mouth every 8 (eight) hours as needed. For nausea and vomiting    . Pyridoxine HCl (VITAMIN B-6) 25 MG tablet Take 25 mg by mouth daily.    Marland Kitchen triamterene-hydrochlorothiazide (DYAZIDE) 37.5-25 MG per capsule Take 1 capsule by mouth every morning.     No current facility-administered medications on file prior to visit.    Review of Systems:  As per HPI- otherwise negative.   Physical Examination: Filed Vitals:   11/14/14 1020  BP: 158/108  Pulse: 99  Temp: 98.5 F (36.9 C)  Resp: 18   Filed Vitals:   11/14/14 1020  Height: 5'  6" (1.676 m)  Weight: 213 lb (96.616 kg)   Body mass index is 34.4 kg/(m^2). Ideal Body Weight: Weight in (lb) to have BMI = 25: 154.6  GEN: WDWN, NAD, Non-toxic, A & O x 3, overweight, looks well HEENT: Atraumatic, Normocephalic. Neck supple. No masses, No LAD.  Bilateral TM wnl, oropharynx normal.  PEERL,EOMI.   Fundoscopic exam wnl, small subconjunctival hemorrhage on the left, lateral to pupil Ears and Nose: No external deformity. CV: RRR, No M/G/R. No JVD. No thrill. No extra heart sounds. PULM: CTA B, no wheezes, crackles, rhonchi. No retractions. No resp. distress. No accessory muscle use. ABD: S, NT, ND. EXTR: No c/c/e NEURO Normal gait.  PSYCH: Normally interactive. Conversant. Not depressed or  anxious appearing.  Calm demeanor.   BP Readings from Last 3 Encounters:  11/14/14 158/108  07/18/14 137/90  07/11/14 138/79     Results for orders placed or performed in visit on 11/14/14  POCT Influenza A/B  Result Value Ref Range   Influenza A, POC Negative    Influenza B, POC Negative   POCT CBC  Result Value Ref Range   WBC 6.0 4.6 - 10.2 K/uL   Lymph, poc 2.3 0.6 - 3.4   POC LYMPH PERCENT 38.8 10 - 50 %L   MID (cbc) 0.5 0 - 0.9   POC MID % 9.0 0 - 12 %M   POC Granulocyte 3.1 2 - 6.9   Granulocyte percent 52.2 37 - 80 %G   RBC 5.18 4.04 - 5.48 M/uL   Hemoglobin 10.8 (A) 12.2 - 16.2 g/dL   HCT, POC 36.2 (A) 37.7 - 47.9 %   MCV 69.8 (A) 80 - 97 fL   MCH, POC 20.8 (A) 27 - 31.2 pg   MCHC 29.8 (A) 31.8 - 35.4 g/dL   RDW, POC 16.8 %   Platelet Count, POC 215 142 - 424 K/uL   MPV 8.6 0 - 99.8 fL     Assessment and Plan: Body aches - Plan: POCT Influenza A/B, POCT CBC, oseltamivir (TAMIFLU) 75 MG capsule  Cough - Plan: HYDROcodone-homatropine (HYCODAN) 5-1.5 MG/5ML syrup  Flu-like symptoms  Likely flu given her sx and normal wbc count.   She has a low hg but states she has thalassemia which is the likely cause.   Treat with tamiflu and hycodan. She will follow-up if not feeling better soon- Sooner if worse.     Signed Lamar Blinks, MD

## 2014-11-14 NOTE — Patient Instructions (Signed)
You may have the flu although your test is negative Use the cough syrup as needed- it will make you sleepy so do not use when you need to drive You can also use tamiflu if you like but you do not have to Rest and drink plenty of fluids Let me know if you do not feel better in the next couple of days- Sooner if worse.

## 2015-03-23 ENCOUNTER — Ambulatory Visit (INDEPENDENT_AMBULATORY_CARE_PROVIDER_SITE_OTHER): Payer: PRIVATE HEALTH INSURANCE | Admitting: Family Medicine

## 2015-03-23 ENCOUNTER — Encounter: Payer: Self-pay | Admitting: Family Medicine

## 2015-03-23 VITALS — BP 151/107 | HR 73 | Temp 98.4°F | Resp 16 | Ht 66.0 in | Wt 211.0 lb

## 2015-03-23 DIAGNOSIS — I10 Essential (primary) hypertension: Secondary | ICD-10-CM | POA: Diagnosis not present

## 2015-03-23 DIAGNOSIS — E669 Obesity, unspecified: Secondary | ICD-10-CM | POA: Diagnosis not present

## 2015-03-23 DIAGNOSIS — R350 Frequency of micturition: Secondary | ICD-10-CM

## 2015-03-23 LAB — POCT URINALYSIS DIPSTICK
BILIRUBIN UA: NEGATIVE
GLUCOSE UA: NEGATIVE
KETONES UA: NEGATIVE
Leukocytes, UA: NEGATIVE
NITRITE UA: NEGATIVE
PH UA: 6.5
Protein, UA: NEGATIVE
RBC UA: NEGATIVE
Spec Grav, UA: 1.015
Urobilinogen, UA: 0.2

## 2015-03-23 LAB — COMPREHENSIVE METABOLIC PANEL
ALK PHOS: 40 U/L (ref 39–117)
ALT: 10 U/L (ref 0–35)
AST: 15 U/L (ref 0–37)
Albumin: 4.1 g/dL (ref 3.5–5.2)
BUN: 9 mg/dL (ref 6–23)
CALCIUM: 9.5 mg/dL (ref 8.4–10.5)
CO2: 27 meq/L (ref 19–32)
Chloride: 103 mEq/L (ref 96–112)
Creat: 0.79 mg/dL (ref 0.50–1.10)
GLUCOSE: 90 mg/dL (ref 70–99)
POTASSIUM: 3.9 meq/L (ref 3.5–5.3)
Sodium: 138 mEq/L (ref 135–145)
Total Bilirubin: 0.6 mg/dL (ref 0.2–1.2)
Total Protein: 7.5 g/dL (ref 6.0–8.3)

## 2015-03-23 LAB — POCT UA - MICROSCOPIC ONLY
CASTS, UR, LPF, POC: NEGATIVE
CRYSTALS, UR, HPF, POC: NEGATIVE
Mucus, UA: POSITIVE
Yeast, UA: NEGATIVE

## 2015-03-23 LAB — LIPID PANEL
CHOLESTEROL: 149 mg/dL (ref 0–200)
HDL: 51 mg/dL (ref >=46)
LDL Cholesterol: 85 mg/dL (ref 0–99)
Total CHOL/HDL Ratio: 2.9 Ratio
Triglycerides: 67 mg/dL (ref <150)
VLDL: 13 mg/dL (ref 0–40)

## 2015-03-23 MED ORDER — NITROFURANTOIN MONOHYD MACRO 100 MG PO CAPS: 100.0000 mg | ORAL_CAPSULE | Freq: Two times a day (BID) | ORAL | Status: DC

## 2015-03-23 MED ORDER — AMLODIPINE BESYLATE 10 MG PO TABS: ORAL_TABLET | ORAL | Status: DC

## 2015-03-23 NOTE — Progress Notes (Addendum)
[facility] [address], [place] [address] [phone]  Date:  03/23/2015   Name:  [name]   DOB:  May 30, 1976   MRN:  [id number]  PCP:  No primary care provider on file.    Chief Complaint: Hypertension and Urinary Frequency   History of Present Illness:  [name] is a 39 y.o. very pleasant female patient who presents with the following:  Here today to follow-up- I saw her for illness back in March of this year She has some concerns about her BP- she has noted it is running 140/ 100 or so at home.  She has noted this for he last 3-4 months.  She checks her BP at home daily She was on BP medication about 3 years ago- developed HTN during her pregnancy with her youngest child.  She did not have HTN prior to this pregnancy.   Her mother also has HTN  When she was pregnant she took carvedilol, triamterene  In addition, she has noted urinary frequency.  It sometimes has a strong smell.  This has come and gone for 3-4 months. No dysuria, no hematuria   She does not plan another pregnancy LMP 6/21  BP Readings from Last 3 Encounters:  03/23/15 151/107  11/14/14 130/90  07/18/14 137/90     Patient Active Problem List   Diagnosis Date Noted  . 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 Elderly multigravida with antepartum condition or complication 41/74/0814  . Beta thalassemia trait 10/11/2011    Past Medical History  Diagnosis Date  . Beta thalassemia trait   . No pertinent past medical history   . Essential hypertension, benign 06/12/2012    [facility] abstract note also started on two  new meds for this see med list.    Past Surgical History  Procedure Laterality Date  . Cesarean section    . Cesarean section  04/17/2012    Procedure: CESAREAN SECTION;  Surgeon: Shelly Bombard, MD;  Location: [facility];  Service: Gynecology;  Laterality: N/A;    History  Substance Use Topics  . Smoking status: Never  Smoker   . Smokeless tobacco: Not on file  . Alcohol Use: No    Family History  Problem Relation Age of Onset  . Sickle cell anemia Brother   . Hypertension Mother     Allergies  Allergen Reactions  . Amoxicillin Rash    Medication list has been reviewed and updated.  No current outpatient prescriptions on file prior to visit.   No current facility-administered medications on file prior to visit.    Review of Systems:  As per HPI- otherwise negative.   Physical Examination: Filed Vitals:   03/23/15 1051  BP: 151/107  Pulse: 73  Temp: 98.4 F (36.9 C)  Resp: 16   Filed Vitals:   03/23/15 1051  Height: 5' 6"  (1.676 m)  Weight: 211 lb (95.709 kg)   Body mass index is 34.07 kg/(m^2). Ideal Body Weight: Weight in (lb) to have BMI = 25: 154.6  GEN: WDWN, NAD, Non-toxic, A & O x 3, obese, looks well HEENT: Atraumatic, Normocephalic. Neck supple. No masses, No LAD. Ears and Nose: No external deformity. CV: RRR, No M/G/R. No JVD. No thrill. No extra heart sounds. PULM: CTA B, no wheezes, crackles, rhonchi. No retractions. No resp. distress. No accessory muscle use. ABD: S, NT, ND. No rebound. No HSM.  abd is benign, no CVA tenderness  EXTR: No  c/c/e NEURO Normal gait.  PSYCH: Normally interactive. Conversant. Not depressed or anxious appearing.  Calm demeanor.   She is fasting today and would like to do a chl check as long as we are drawing blood   Results for orders placed or performed in visit on 03/23/15  POCT urinalysis dipstick  Result Value Ref Range   Color, UA yellow    Clarity, UA clear    Glucose, UA neg    Bilirubin, UA neg    Ketones, UA neg    Spec Grav, UA 1.015    Blood, UA neg    pH, UA 6.5    Protein, UA neg    Urobilinogen, UA 0.2    Nitrite, UA neg    Leukocytes, UA Negative Negative  POCT UA - Microscopic Only  Result Value Ref Range   WBC, Ur, HPF, POC 0-1    RBC, urine, microscopic 4-6    Bacteria, U Microscopic 1+    Mucus, UA  pos    Epithelial cells, urine per micros 0-2    Crystals, Ur, HPF, POC neg    Casts, Ur, LPF, POC neg    Yeast, UA neg      Assessment and Plan: Frequency of urination - Plan: POCT urinalysis dipstick, POCT UA - Microscopic Only, Urine culture, nitrofurantoin, macrocrystal-monohydrate, (MACROBID) 100 MG capsule  Essential hypertension - Plan: Comprehensive metabolic panel, amLODipine (NORVASC) 10 MG tablet  Obesity - Plan: Lipid panel  Start amlodipine for her HTN, follow- up in 2 weeks macrobid for possible UTI while we await urine culture Will plan further follow- up pending labs.  Signed Lamar Blinks, MD   Called to check on her 7/20: no answer, will try back  Results for orders placed or performed in visit on 03/23/15  Urine culture  Result Value Ref Range   Colony Count 80,000 COLONIES/ML    Organism ID, Bacteria Multiple bacterial morphotypes present, none    Organism ID, Bacteria predominant. Suggest appropriate recollection if     Organism ID, Bacteria clinically indicated.   Lipid panel  Result Value Ref Range   Cholesterol 149 0 - 200 mg/dL   Triglycerides 67 <150 mg/dL   HDL 51 >=46 mg/dL   Total CHOL/HDL Ratio 2.9 Ratio   VLDL 13 0 - 40 mg/dL   LDL Cholesterol 85 0 - 99 mg/dL  Comprehensive metabolic panel  Result Value Ref Range   Sodium 138 135 - 145 mEq/L   Potassium 3.9 3.5 - 5.3 mEq/L   Chloride 103 96 - 112 mEq/L   CO2 27 19 - 32 mEq/L   Glucose, Bld 90 70 - 99 mg/dL   BUN 9 6 - 23 mg/dL   Creat 0.79 0.50 - 1.10 mg/dL   Total Bilirubin 0.6 0.2 - 1.2 mg/dL   Alkaline Phosphatase 40 39 - 117 U/L   AST 15 0 - 37 U/L   ALT 10 0 - 35 U/L   Total Protein 7.5 6.0 - 8.3 g/dL   Albumin 4.1 3.5 - 5.2 g/dL   Calcium 9.5 8.4 - 10.5 mg/dL  POCT urinalysis dipstick  Result Value Ref Range   Color, UA yellow    Clarity, UA clear    Glucose, UA neg    Bilirubin, UA neg    Ketones, UA neg    Spec Grav, UA 1.015    Blood, UA neg    pH, UA 6.5     Protein, UA neg    Urobilinogen, UA 0.2  Nitrite, UA neg    Leukocytes, UA Negative Negative  POCT UA - Microscopic Only  Result Value Ref Range   WBC, Ur, HPF, POC 0-1    RBC, urine, microscopic 4-6    Bacteria, U Microscopic 1+    Mucus, UA pos    Epithelial cells, urine per micros 0-2    Crystals, Ur, HPF, POC neg    Casts, Ur, LPF, POC neg    Yeast, UA neg

## 2015-03-23 NOTE — Patient Instructions (Signed)
Start on norvasc 44m (1/2 tablet) Please give me an update regarding your BP in about 2 weeks.  If you are still running higher than 135/90 we will go up to a whole tablet I will also be in touch with the rest of your labs Do work on losing weight through diet and exercise- this will likely help with your blood pressure!   We are going to treat you with macrobid for your urine for one week- take it twice a day

## 2015-03-24 LAB — URINE CULTURE: Colony Count: 80000

## 2015-03-29 ENCOUNTER — Encounter: Payer: Self-pay | Admitting: Family Medicine

## 2015-03-29 NOTE — Addendum Note (Signed)
Addended by: Lamar Blinks C on: 03/29/2015 07:28 PM   Modules accepted: Orders

## 2015-09-02 ENCOUNTER — Encounter: Payer: Self-pay | Admitting: Family Medicine

## 2015-09-02 ENCOUNTER — Ambulatory Visit (INDEPENDENT_AMBULATORY_CARE_PROVIDER_SITE_OTHER): Payer: PRIVATE HEALTH INSURANCE | Admitting: Family Medicine

## 2015-09-02 VITALS — BP 130/76 | HR 78 | Temp 98.6°F | Resp 16 | Ht 66.5 in | Wt 211.2 lb

## 2015-09-02 DIAGNOSIS — R011 Cardiac murmur, unspecified: Secondary | ICD-10-CM | POA: Insufficient documentation

## 2015-09-02 DIAGNOSIS — R599 Enlarged lymph nodes, unspecified: Secondary | ICD-10-CM

## 2015-09-02 DIAGNOSIS — I789 Disease of capillaries, unspecified: Secondary | ICD-10-CM | POA: Diagnosis not present

## 2015-09-02 DIAGNOSIS — I781 Nevus, non-neoplastic: Secondary | ICD-10-CM

## 2015-09-02 DIAGNOSIS — R591 Generalized enlarged lymph nodes: Secondary | ICD-10-CM

## 2015-09-02 NOTE — Progress Notes (Signed)
[facility] [address] [place] [address] [phone]  Date:  09/02/2015   Name:  [name]   DOB:  August 17, 1976   MRN:  [id number]  PCP:  No primary care provider on file.    Chief Complaint: lump   History of Present Illness:  [name] is a 39 y.o. very pleasant female patient who presents with the following:  She has noted a lump in the right side of her neck for 2-3 days. It is a bit tender but not bad.  She is not sure why it is there.  She has not been ill. Never had this in the past She has not noted a ST, cough, runny nose- she has felt well overall.   She has not noted any other bumps anywhere.   She is taking her BP medications She has noted a lot of spider veins on the left lower leg- these ache and appear ugly to her- she would like to have them treated if possible OW no concerns today  Patient Active Problem List   Diagnosis Date Noted  . Benign essential HTN 03/23/2015  . 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 Elderly multigravida with antepartum condition or complication 02/29/9484  . Beta thalassemia trait 10/11/2011    Past Medical History  Diagnosis Date  . Beta thalassemia trait   . No pertinent past medical history   . Essential hypertension, benign 06/12/2012    [facility] abstract note also started on two  new meds for this see med list.    Past Surgical History  Procedure Laterality Date  . Cesarean section    . Cesarean section  04/17/2012    Procedure: CESAREAN SECTION;  Surgeon: Shelly Bombard, MD;  Location: [facility];  Service: Gynecology;  Laterality: N/A;    Social History  Substance Use Topics  . Smoking status: Never Smoker   . Smokeless tobacco: None  . Alcohol Use: No    Family History  Problem Relation Age of Onset  . Sickle cell anemia Brother   . Hypertension Mother     Allergies  Allergen Reactions  . Amoxicillin Rash    Medication list has been  reviewed and updated.  Current Outpatient Prescriptions on File Prior to Visit  Medication Sig Dispense Refill  . amLODipine (NORVASC) 10 MG tablet Start with a 1/2 tablet (45m) daily and adjust as directed 90 tablet 3  . Multiple Vitamin (MULTIVITAMIN) tablet Take 1 tablet by mouth daily.     No current facility-administered medications on file prior to visit.    Review of Systems:  As per HPI- otherwise negative.   Physical Examination: Filed Vitals:   09/02/15 1016  BP: 130/76  Pulse: 78  Temp: 98.6 F (37 C)  Resp: 16   Filed Vitals:   09/02/15 1016  Height: 5' 6.5" (1.689 m)  Weight: 211 lb 3.2 oz (95.8 kg)   Body mass index is 33.58 kg/(m^2). Ideal Body Weight: Weight in (lb) to have BMI = 25: 156.9  GEN: WDWN, NAD, Non-toxic, A & O x 3, obese, looks well and healthy HEENT: Atraumatic, Normocephalic. Neck supple. No masses, No LAD.  Bilateral TM wnl, oropharynx normal.  PEERL,EOMI.   Ears and Nose: No external deformity. CV: RRR, 2/6 systolic murmur but no G/R. No JVD. No thrill. No extra heart sounds. PULM: CTA B, no wheezes, crackles, rhonchi. No retractions. No resp. distress. No accessory muscle use. EXTR: No c/c/e  NEURO Normal gait.  PSYCH: Normally interactive. Conversant. Not depressed or anxious appearing.  Calm demeanor.  She has an approx 1cm palpable node in the right neck posterior to the ear. No other cervical, supraclavicular or axillary nodes palpated  She does have dense superficial spider veins on the left calf  Assessment and Plan: Lymphadenopathy of head and neck - Plan: [place] Misc Soft Tissue  Heart murmur  Spider veins of limb - Plan: Ambulatory referral to Vascular Surgery  Suspect that lymph node is benign but will arrange for [place]- follow up pending restuls Referral to vascular and vein  Signed Lamar Blinks, MD

## 2015-09-02 NOTE — Patient Instructions (Signed)
I will arrange for an ultrasound of the bump on your neck to make sure it is nothing of concern I will also refer you to the vascular and vein center to look at your spider veins

## 2015-09-10 ENCOUNTER — Ambulatory Visit
Admission: RE | Admit: 2015-09-10 | Discharge: 2015-09-10 | Disposition: A | Discharge status: Home / Self Care | Payer: PRIVATE HEALTH INSURANCE | Source: Ambulatory Visit | Attending: Family Medicine | Admitting: Family Medicine

## 2015-09-10 ENCOUNTER — Encounter: Payer: Self-pay | Admitting: Family Medicine

## 2015-09-10 DIAGNOSIS — R591 Generalized enlarged lymph nodes: Secondary | ICD-10-CM

## 2015-10-15 ENCOUNTER — Encounter: Payer: Self-pay | Admitting: *Deleted

## 2015-10-21 ENCOUNTER — Ambulatory Visit: Payer: Medicaid Other | Admitting: *Deleted

## 2016-06-24 ENCOUNTER — Ambulatory Visit (INDEPENDENT_AMBULATORY_CARE_PROVIDER_SITE_OTHER): Payer: PRIVATE HEALTH INSURANCE | Admitting: Physician Assistant

## 2016-06-24 VITALS — BP 140/94 | HR 70 | Temp 98.4°F | Resp 17 | Ht 66.5 in | Wt 212.0 lb

## 2016-06-24 DIAGNOSIS — R5383 Other fatigue: Secondary | ICD-10-CM | POA: Diagnosis not present

## 2016-06-24 DIAGNOSIS — R002 Palpitations: Secondary | ICD-10-CM | POA: Diagnosis not present

## 2016-06-24 DIAGNOSIS — I10 Essential (primary) hypertension: Secondary | ICD-10-CM | POA: Diagnosis not present

## 2016-06-24 DIAGNOSIS — R011 Cardiac murmur, unspecified: Secondary | ICD-10-CM

## 2016-06-24 LAB — COMPREHENSIVE METABOLIC PANEL
ALBUMIN: 4.3 g/dL (ref 3.6–5.1)
ALK PHOS: 45 U/L (ref 33–115)
ALT: 9 U/L (ref 6–29)
AST: 15 U/L (ref 10–30)
BILIRUBIN TOTAL: 0.6 mg/dL (ref 0.2–1.2)
BUN: 10 mg/dL (ref 7–25)
CALCIUM: 9.3 mg/dL (ref 8.6–10.2)
CO2: 24 mmol/L (ref 20–31)
CREATININE: 0.81 mg/dL (ref 0.50–1.10)
Chloride: 102 mmol/L (ref 98–110)
Glucose, Bld: 88 mg/dL (ref 65–99)
Potassium: 3.7 mmol/L (ref 3.5–5.3)
Sodium: 138 mmol/L (ref 135–146)
TOTAL PROTEIN: 7.8 g/dL (ref 6.1–8.1)

## 2016-06-24 LAB — IRON: IRON: 46 ug/dL (ref 40–190)

## 2016-06-24 LAB — TSH: TSH: 0.43 m[IU]/L

## 2016-06-24 LAB — IBC PANEL
%SAT: 13 % (ref 11–50)
TIBC: 364 ug/dL (ref 250–450)
UIBC: 318 ug/dL (ref 125–400)

## 2016-06-24 LAB — CBC
HEMATOCRIT: 36.6 % (ref 35.0–45.0)
HEMOGLOBIN: 11.8 g/dL (ref 11.7–15.5)
MCH: 21.2 pg — ABNORMAL LOW (ref 27.0–33.0)
MCHC: 32.2 g/dL (ref 32.0–36.0)
MCV: 65.7 fL — ABNORMAL LOW (ref 80.0–100.0)
Platelets: 225 10*3/uL (ref 140–400)
RBC: 5.57 MIL/uL — AB (ref 3.80–5.10)
RDW: 17 % — ABNORMAL HIGH (ref 11.0–15.0)
WBC: 5.2 10*3/uL (ref 3.8–10.8)

## 2016-06-24 LAB — FERRITIN: FERRITIN: 17 ng/mL (ref 10–232)

## 2016-06-24 MED ORDER — AMLODIPINE BESYLATE 10 MG PO TABS: ORAL_TABLET | ORAL | 3 refills | Status: DC

## 2016-06-24 NOTE — Progress Notes (Signed)
Patient ID: [name], female    DOB: 1975-12-28, 40 y.o.   MRN: [id number]  PCP: No primary care provider on file.  Chief Complaint  Patient presents with  . Medication Refill    amlodipine  . Fatigue    Onset 1-2 months    Subjective:   HPI:  22 yof presents with above complaints.   Fatigue: Started 1-2 months ago. 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 Has done this for 2 years but more fatigued past couple months. Hx anemia per pt and chart review. Mentions heavy periods regularly. Denies possibility of pregnancy. No known tick exposures or rash.   Denies abd pain, recurrent headaches, chest pain, unusual sob, n/v, presyncope, syncope.   HTN: Takes amlodipine daily. Has been taking 5 mg daily. Ran out last week. Checks BP at work, runs 140-90s consistently.   Also mentions palpitations 1-2 x per week for past few months. Last mins at a time. No assoc CP. Denies nicotine, alcohol, drug use.    Patient Active Problem List   Diagnosis Date Noted  . Heart murmur 09/02/2015  . Benign essential HTN 03/23/2015  . 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 Elderly multigravida with antepartum condition or complication 68/61/6837  . Beta thalassemia trait 10/11/2011    Past Medical History:  Diagnosis Date  . Beta thalassemia trait   . Essential hypertension, benign 06/12/2012   [facility] abstract note also started on two  new meds for this see med list.  . No pertinent past medical history      Prior to Admission medications   Medication Sig Start Date End Date Taking? Authorizing Provider  amLODipine (NORVASC) 10 MG tablet Start with a 1/2 tablet (70m) daily and adjust as directed 03/23/15  Yes JGay FillerCopland, MD  Multiple Vitamin (MULTIVITAMIN) tablet Take 1 tablet by mouth daily.   Yes Historical Provider, MD    Allergies  Allergen Reactions  .  Amoxicillin Rash    Past Surgical History:  Procedure Laterality Date  . CESAREAN SECTION    . CESAREAN SECTION  04/17/2012   Procedure: CESAREAN SECTION;  Surgeon: CShelly Bombard MD;  Location[facility];  Service: Gynecology;  Laterality: N/A;    Family History  Problem Relation Age of Onset  . Sickle cell anemia Brother   . Hypertension Mother     Social History   Social History  . Marital status: Married    Spouse name: N/A  . Number of children: N/A  . Years of education: N/A   Social History Main Topics  . Smoking status: Never Smoker  . Smokeless tobacco: None  . Alcohol use No  . Drug use: No  . Sexual activity: Yes   Other Topics Concern  . None   Social History Narrative  . None    Review of Systems  See HPI     Objective:  Physical Exam  Constitutional: She is oriented to person, place, and time. She appears well-developed and well-nourished.  Non-toxic appearance. She does not have a sickly appearance. She does not appear ill. No distress.  HENT:  Mouth/Throat: Uvula is midline, oropharynx is clear and moist and mucous membranes are normal.  Neck: Carotid bruit is not present. No Brudzinski's sign noted. No thyroid mass and no thyromegaly present.  Cardiovascular: Normal rate, regular rhythm, S1 normal and S2 normal.  PMI is not displaced.  Exam reveals no S3 and no S4.   Murmur heard.  Systolic murmur is present with a grade of 2/6  Pulses:      Dorsalis pedis pulses are 2+ on the right side, and 2+ on the left side.  Pulmonary/Chest: Effort normal and breath sounds normal. No tachypnea.  Abdominal: Soft. Normal appearance and bowel sounds are normal.  Lymphadenopathy:       Head (right side): No submental, no submandibular and no tonsillar adenopathy present.       Head (left side): No submental, no submandibular and no tonsillar adenopathy present.    She has no cervical adenopathy.  Neurological: She is alert and oriented to person, place, and  time.  Skin: No rash noted.    BP (!) 140/94 (BP Location: Right Arm, Patient Position: Sitting, Cuff Size: Large)   Pulse 70   Temp 98.4 F (36.9 C) (Oral)   Resp 17   Ht 5' 6.5" (1.689 m)   Wt 212 lb (96.2 kg)   LMP 05/23/2016   SpO2 100%   BMI 33.71 kg/m   12 lead EKG: NSR with LVH. No ST/T wave changes suggestive of ischemia.     Assessment & Plan:   Fatigue, unspecified type - Plan: EKG 12-Lead, CBC, Comprehensive metabolic panel, TSH, Iron, IBC Panel, Ferritin  Heart murmur - Plan: Ambulatory referral to Cardiology  Palpitations - Plan: EKG 12-Lead, Ambulatory referral to Cardiology  Benign essential HTN  Essential hypertension - Plan: amLODipine (NORVASC) 10 MG tablet -- Fatigue likely multifactorial - suspect night shift with lack of sleep contributing -- Will check lab work to further eval fatigue -- Refilled amlodipine - increasing from 5 to 10 mg qd given pts BP typically 140/90s at home  -- Referral to cardiology for palpitations past couple months (holter?) and murmur noted on exam with no prior TTE on chart review   Regina Gutting, PA-C Physician Assistant-Certified [facility]  06/24/2016 10:27 AM

## 2016-06-24 NOTE — Patient Instructions (Addendum)
We are checking multiple labs today and I will let you know these results.  Your EKG was reassuring today.  We have refilled your amlodipine for one year, we have increased the dose from 5 to 10 mg daily.  Please check your blood pressure weekly at work and record these values, please bring them with you to future appointments.  I have referred you to cardiology given your heart has been racing and your murmur on exam.

## 2016-09-14 ENCOUNTER — Ambulatory Visit (INDEPENDENT_AMBULATORY_CARE_PROVIDER_SITE_OTHER): Payer: PRIVATE HEALTH INSURANCE | Admitting: Family Medicine

## 2016-09-14 DIAGNOSIS — R059 Cough, unspecified: Secondary | ICD-10-CM | POA: Insufficient documentation

## 2016-09-14 DIAGNOSIS — R05 Cough: Secondary | ICD-10-CM

## 2016-09-14 MED ORDER — HYDROCODONE-HOMATROPINE 5-1.5 MG/5ML PO SYRP: 5.0000 mL | ORAL_SOLUTION | Freq: Three times a day (TID) | ORAL | 0 refills | Status: DC | PRN

## 2016-09-14 NOTE — Assessment & Plan Note (Signed)
With accompanying symptoms concerning for flu (myalgias, sore throat, chills, fever, fatigue, HA). Afebrile, alert, and well-hydrated on exam today. As patient's symptoms began three days ago, she is out of time window for Tamiflu. As patient's O2 sat is WNL and lungs at CTAB, will not proceed with CXR at this time, though could consider if patient returns with worsening symptoms.  - Treat cough with Hycodan syrup q8 PRN - Cepachol and Chloraseptic spray for sore throat - Ibuprofen and/or Tylenol for body aches, headaches - Encouraged to stay hydrated by drinking water - Provided work excuse note for the next two days - Return precautions given

## 2016-09-14 NOTE — Progress Notes (Signed)
   Subjective:    Patient ID: [name], female    DOB: [birth year], 41 y.o.   MRN: [id number]  HPI  Patient presents with cough, body aches, and fatigue.   6 days ago, patient developed a productive cough. She took Mucinex and these symptoms resolved. She returned to work at that time, where multiple of her coworkers were sick. 3 days ago she developed a productive cough again, as well as fatigue, sore throat, headache, generalized body aches, decreased appetite, chills, and fever to 101F. Sore throat has been improved with throat lozenges. Mucinex has not helped much with cough or congestion. Denies nausea, vomiting, diarrhea. She has not been eating much but has been drinking a lot of ginger ale. She reports the coughing has become so bad that she has chest wall and rib pain and is unable to sleep at night, even when propping her head up in bed. She did have a flu shot this season in late Nov/early March. She had the flu a couple years ago and says the symptoms feel similar.   Patient is a never smoker.   Review of Systems See HPI.     Objective:   Physical Exam  Constitutional: She is oriented to person, place, and time.  Alert but appears to feel generally unwell. Wrapped up in blankets in exam room.   HENT:  Head: Normocephalic and atraumatic.  MMM, mild oropharyngeal erythema but no exudates, no tonsillar enlargement  Eyes: Conjunctivae and EOM are normal. Right eye exhibits no discharge. Left eye exhibits no discharge.  Neck: Neck supple.  Pulmonary/Chest: Effort normal and breath sounds normal. No respiratory distress. She has no wheezes.  Lymphadenopathy:    She has no cervical adenopathy.  Neurological: She is alert and oriented to person, place, and time.  Skin: Skin is warm and dry.  Psychiatric: She has a normal mood and affect. Her behavior is normal.      Assessment & Plan:  Cough With accompanying symptoms concerning for flu (myalgias, sore throat, chills,  fever, fatigue, HA). Afebrile, alert, and well-hydrated on exam today. As patient's symptoms began three days ago, she is out of time window for Tamiflu. As patient's O2 sat is WNL and lungs at CTAB, will not proceed with CXR at this time, though could consider if patient returns with worsening symptoms.  - Treat cough with Hycodan syrup q8 PRN - Cepachol and Chloraseptic spray for sore throat - Ibuprofen and/or Tylenol for body aches, headaches - Encouraged to stay hydrated by drinking water - Provided work excuse note for the next two days - Return precautions given   Adin Hector, MD, MPH PGY-2 [facility] Pager [phone]

## 2016-09-14 NOTE — Patient Instructions (Addendum)
It was nice meeting you today [name]!  You can begin using the cough syrup as prescribed up to every 8 hours as needed for cough. You can also eat honey, either alone or mixed in a warm beverage, three or four times a day to help with cough. Putting a couple extra pillow behind your head at night may also help you sleep.   You can take ibuprofen or Tylenol for your fever, body aches, and headaches.   It is very important to stay hydrated, so try to drink as much water as you can.   If your symptoms are worsening or do not start to get better in a few days, please come back in.   If you have any questions or concerns, please feel free to call the clinic.   Be well,  Dr. Avon Gully     IF you received an x-ray today, you will receive an invoice from [facility]. Please contact [facility] at [phone] with questions or concerns regarding your invoice.   IF you received labwork today, you will receive an invoice from [facility]. Please contact [facility] at [phone] with questions or concerns regarding your invoice.   Our billing staff will not be able to assist you with questions regarding bills from these companies.  You will be contacted with the lab results as soon as they are available. The fastest way to get your results is to activate your My Chart account. Instructions are located on the last page of this paperwork. If you have not heard from [place] regarding the results in 2 weeks, please contact this office.

## 2017-01-10 ENCOUNTER — Encounter: Payer: Self-pay | Admitting: Internal Medicine

## 2017-01-27 ENCOUNTER — Ambulatory Visit (INDEPENDENT_AMBULATORY_CARE_PROVIDER_SITE_OTHER): Payer: PRIVATE HEALTH INSURANCE | Admitting: Internal Medicine

## 2017-01-27 ENCOUNTER — Encounter: Payer: Self-pay | Admitting: Internal Medicine

## 2017-01-27 ENCOUNTER — Encounter (INDEPENDENT_AMBULATORY_CARE_PROVIDER_SITE_OTHER): Payer: Self-pay

## 2017-01-27 VITALS — BP 130/80 | HR 74 | Ht 65.0 in | Wt 219.4 lb

## 2017-01-27 DIAGNOSIS — R002 Palpitations: Secondary | ICD-10-CM

## 2017-01-27 DIAGNOSIS — R9431 Abnormal electrocardiogram [ECG] [EKG]: Secondary | ICD-10-CM | POA: Diagnosis not present

## 2017-01-27 DIAGNOSIS — I10 Essential (primary) hypertension: Secondary | ICD-10-CM | POA: Diagnosis not present

## 2017-01-27 DIAGNOSIS — R011 Cardiac murmur, unspecified: Secondary | ICD-10-CM

## 2017-01-27 MED ORDER — AMLODIPINE BESYLATE 10 MG PO TABS: 5.0000 mg | ORAL_TABLET | Freq: Every day | ORAL | 3 refills | Status: DC

## 2017-01-27 MED ORDER — METOPROLOL SUCCINATE ER 25 MG PO TB24: 25.0000 mg | ORAL_TABLET | Freq: Every day | ORAL | 3 refills | Status: DC

## 2017-01-27 NOTE — Patient Instructions (Signed)
Your physician has recommended you make the following change in your medication:  1.) decrease amlodipine to 5 mg daily 2.) start metoprolol succinate (Toprol XL) 25 mg once daily  Your physician has requested that you have an echocardiogram. Echocardiography is a painless test that uses sound waves to create images of your heart. It provides your doctor with information about the size and shape of your heart and how well your heart's chambers and valves are working. This procedure takes approximately one hour. There are no restrictions for this procedure.  Your physician recommends that you schedule a follow-up appointment in: 6 weeks with Dr. Harrington Challenger.

## 2017-01-27 NOTE — Progress Notes (Signed)
Cardiology Office Note   Date:  01/27/2017   ID:  RODOLFO NOTARO, DOB Jan 23, 1976, MRN [id number]  PCP:  Darreld Mclean, MD  Cardiologist:   Dorris Carnes, MD    Pt referred by T Rosanne Sack for eval of palpitations and murmur     History of Present Illness: [name] is a 41 y.o. female with a history of fatigue, murmur, palpitations  Referred for evlaution of these by Araceli Bouche PA   Chest palpitations  occurs off and on  One to two times week  Last a few seconds  Sits down  Occasionally dizzy though not every time  Drnks 4 oz of coffee per day   Has had BP treated for almost 1 year  Off when had son    No Chst pains    Occasional dizziness  Hx   Vertigo though no exacerbation for awhile  Fatigued  Goes to bed around 11:30   Sleeps until 4 to 4:30  AM   Current Meds  Medication Sig  . amLODipine (NORVASC) 10 MG tablet Take one tab once daily.  . Multiple Vitamin (MULTIVITAMIN) tablet Take 1 tablet by mouth daily.     Allergies:   Amoxicillin   Past Medical History:  Diagnosis Date  . Beta thalassemia trait   . Essential hypertension, benign 06/12/2012   [facility] abstract note also started on two  new meds for this see med list.  . No pertinent past medical history     Past Surgical History:  Procedure Laterality Date  . CESAREAN SECTION    . CESAREAN SECTION  04/17/2012   Procedure: CESAREAN SECTION;  Surgeon: Shelly Bombard, MD;  Location: [facility];  Service: Gynecology;  Laterality: N/A;     Social History:  The patient  reports that she has never smoked. She has never used smokeless tobacco. She reports that she does not drink alcohol or use drugs.   Family History:  The patient's family history includes Hypertension in her mother; Sickle cell anemia in her brother.    ROS:  Please see the history of present illness. All other systems are reviewed and  Negative to the above problem except as noted.    PHYSICAL EXAM: VS:   BP 130/80   Pulse 74   Ht 5' 5"  (1.651 m)   Wt 219 lb 6.4 oz (99.5 kg)   SpO2 98%   BMI 36.51 kg/m   GEN: Well nourished, well developed, in no acute distress  HEENT: normal  Neck: no JVD, carotid bruits, or masses Cardiac: RRR; no murmurs, rubs, or gallops,no edema   Varicosities L leg   Respiratory:  clear to auscultation bilaterally, normal work of breathing GI: soft, nontender, nondistended, + BS  No hepatomegaly  MS: no deformity Moving all extremities   Skin: warm and dry, no rash Neuro:  Strength and sensation are intact Psych: euthymic mood, full affect   EKG:  EKG is not ordered today.  In October SR 63 bpm  LVH     Lipid Panel    Component Value Date/Time   CHOL 149 03/23/2015 1127   TRIG 67 03/23/2015 1127   HDL 51 03/23/2015 1127   CHOLHDL 2.9 03/23/2015 1127   VLDL 13 03/23/2015 1127   LDLCALC 85 03/23/2015 1127      Wt Readings from Last 3 Encounters:  01/27/17 219 lb 6.4 oz (99.5 kg)  09/14/16 208 lb 9.6 oz (94.6 kg)  06/24/16 212 lb (  96.2 kg)      ASSESSMENT AND PLAN:  1  Palpitations  Comes/goes  Last seconds  Does get a little dizzy recomm cut back no amlodipine  To 5   Add toprol XL 25  2  HTN  Changes as noted above   F/U 6 wks    3  Mumur  I do not hear today But EKG abnormal  Get echo  4  Varicose veins  Extensive on L leg  Discussed vein speciallsit     Current medicines are reviewed at length with the patient today.  The patient does not have concerns regarding medicines.  Signed, Dorris Carnes, MD  01/27/2017 11:19 AM    [address], [place], [place]  [address] Phone: [phone]; Fax: [phone]

## 2017-02-10 ENCOUNTER — Ambulatory Visit (HOSPITAL_COMMUNITY): Payer: PRIVATE HEALTH INSURANCE | Attending: Cardiovascular Disease

## 2017-02-10 ENCOUNTER — Other Ambulatory Visit: Payer: Self-pay

## 2017-02-10 DIAGNOSIS — R9431 Abnormal electrocardiogram [ECG] [EKG]: Secondary | ICD-10-CM | POA: Diagnosis not present

## 2017-02-10 DIAGNOSIS — I119 Hypertensive heart disease without heart failure: Secondary | ICD-10-CM | POA: Diagnosis not present

## 2017-02-13 ENCOUNTER — Telehealth: Payer: Self-pay

## 2017-02-13 NOTE — Telephone Encounter (Signed)
-----   Message from Fay Records, MD sent at 02/11/2017  9:47 PM EDT ----- Heart pumping is normal Valves work normally  No new recommendations

## 2017-02-22 ENCOUNTER — Telehealth: Payer: Self-pay | Admitting: Internal Medicine

## 2017-02-22 NOTE — Telephone Encounter (Signed)
Left message to call back  

## 2017-02-22 NOTE — Telephone Encounter (Signed)
New Message   pt verbalized that she is calling for rn  To get her echo results

## 2017-02-22 NOTE — Telephone Encounter (Signed)
Per DPR, left detailed message on home phone of normal echo results and upcoming f/u appointment on July 9 at 9:40 am.  Advised to call back if any concerns in the meantime.

## 2017-03-13 ENCOUNTER — Ambulatory Visit: Payer: PRIVATE HEALTH INSURANCE | Admitting: Internal Medicine

## 2017-04-30 NOTE — Progress Notes (Signed)
Cardiology Office Note   Date:  05/01/2017   ID:  [name], DOB [birth year], MRN [id number]  PCP:  Darreld Mclean, MD  Cardiologist:   Dorris Carnes, MD   F/U for palpitations     History of Present Illness: [name] is a 41 y.o. female with a history of palpitations and murmur  I saw her for the first time in May 2018 When I saw her I added Toprol XL to regimn 25  Recomm f/u BP I did not hear a murmur  REcomm echo  This showed normal LV function  Normal valve function    Palpiatations are better  On Toprol   No dizziness  Breathing is good    Has spasms in legs  (thighs)  Current Meds  Medication Sig  . amLODipine (NORVASC) 10 MG tablet Take 0.5 tablets (5 mg total) by mouth daily.  . metoprolol succinate (TOPROL-XL) 25 MG 24 hr tablet Take 1 tablet (25 mg total) by mouth daily.  . Multiple Vitamin (MULTIVITAMIN) tablet Take 1 tablet by mouth daily.     Allergies:   Amoxicillin   Past Medical History:  Diagnosis Date  . Beta thalassemia trait   . Essential hypertension, benign 06/12/2012   [facility] abstract note also started on two  new meds for this see med list.  . No pertinent past medical history     Past Surgical History:  Procedure Laterality Date  . CESAREAN SECTION    . CESAREAN SECTION  04/17/2012   Procedure: CESAREAN SECTION;  Surgeon: Shelly Bombard, MD;  Location: [facility];  Service: Gynecology;  Laterality: N/A;     Social History:  The patient  reports that she has never smoked. She has never used smokeless tobacco. She reports that she does not drink alcohol or use drugs.   Family History:  The patient's family history includes Hypertension in her mother; Sickle cell anemia in her brother.    ROS:  Please see the history of present illness. All other systems are reviewed and  Negative to the above problem except as noted.    PHYSICAL EXAM: VS:  BP 132/72   Pulse 71   Ht 5' 5"  (1.651 m)   Wt 216 lb  12 oz (98.3 kg)   SpO2 97%   BMI 36.07 kg/m   GEN: Well nourished, well developed, in no acute distress  HEENT: normal  Neck: no JVD, carotid bruits, or masses Cardiac: RRR;  Gr I/VI systolic murmur , rubs, or gallops,no edema  Respiratory:  clear to auscultation bilaterally, normal work of breathing GI: soft, nontender, nondistended, + BS  No hepatomegaly  MS: no deformity Moving all extremities   Skin: warm and dry, no rash Neuro:  Strength and sensation are intact Psych: euthymic mood, full affect   EKG:  EKG is ordered today.  SR 71 bpm      Lipid Panel    Component Value Date/Time   CHOL 149 03/23/2015 1127   TRIG 67 03/23/2015 1127   HDL 51 03/23/2015 1127   CHOLHDL 2.9 03/23/2015 1127   VLDL 13 03/23/2015 1127   LDLCALC 85 03/23/2015 1127      Wt Readings from Last 3 Encounters:  05/01/17 216 lb 12 oz (98.3 kg)  01/27/17 219 lb 6.4 oz (99.5 kg)  09/14/16 208 lb 9.6 oz (94.6 kg)      ASSESSMENT AND PLAN:  1  Palpitations  Improved  Continue toprol XL  2  HTN  Continue meds  3  Cramping  Check labs (BMET, Mg)  Stay hydrated  Stretch  4  Murmur  Echo with no valvular dz  Murmur normal flow.  5  HCM  Check lipids     Current medicines are reviewed at length with the patient today.  The patient does not have concerns regarding medicines.  Signed, Dorris Carnes, MD  05/01/2017 10:51 AM    [address], [place], [place]  [address] Phone: [phone]; Fax: [phone]

## 2017-05-01 ENCOUNTER — Ambulatory Visit (INDEPENDENT_AMBULATORY_CARE_PROVIDER_SITE_OTHER): Payer: PRIVATE HEALTH INSURANCE | Admitting: Internal Medicine

## 2017-05-01 ENCOUNTER — Encounter (INDEPENDENT_AMBULATORY_CARE_PROVIDER_SITE_OTHER): Payer: Self-pay

## 2017-05-01 ENCOUNTER — Encounter: Payer: Self-pay | Admitting: Internal Medicine

## 2017-05-01 VITALS — BP 132/72 | HR 71 | Ht 65.0 in | Wt 216.8 lb

## 2017-05-01 DIAGNOSIS — I10 Essential (primary) hypertension: Secondary | ICD-10-CM

## 2017-05-01 DIAGNOSIS — R002 Palpitations: Secondary | ICD-10-CM | POA: Diagnosis not present

## 2017-05-01 LAB — BASIC METABOLIC PANEL
BUN/Creatinine Ratio: 9 (ref 9–23)
BUN: 7 mg/dL (ref 6–24)
CALCIUM: 9.4 mg/dL (ref 8.7–10.2)
CO2: 22 mmol/L (ref 20–29)
CREATININE: 0.78 mg/dL (ref 0.57–1.00)
Chloride: 99 mmol/L (ref 96–106)
GFR, EST AFRICAN AMERICAN: 108 mL/min/{1.73_m2} (ref >59)
GFR, EST NON AFRICAN AMERICAN: 93 mL/min/{1.73_m2} (ref >59)
Glucose: 88 mg/dL (ref 65–99)
POTASSIUM: 3.4 mmol/L — AB (ref 3.5–5.2)
Sodium: 138 mmol/L (ref 134–144)

## 2017-05-01 LAB — LIPID PANEL
CHOLESTEROL TOTAL: 170 mg/dL (ref 100–199)
Chol/HDL Ratio: 2.9 ratio (ref 0.0–4.4)
HDL: 59 mg/dL (ref >39)
LDL CALC: 99 mg/dL (ref 0–99)
TRIGLYCERIDES: 61 mg/dL (ref 0–149)
VLDL Cholesterol Cal: 12 mg/dL (ref 5–40)

## 2017-05-01 LAB — MAGNESIUM: Magnesium: 2.1 mg/dL (ref 1.6–2.3)

## 2017-05-01 MED ORDER — AMLODIPINE BESYLATE 5 MG PO TABS: 5.0000 mg | ORAL_TABLET | Freq: Every day | ORAL | 3 refills | Status: DC

## 2017-05-01 NOTE — Patient Instructions (Signed)
Your physician recommends that you continue on your current medications as directed. Please refer to the Current Medication list given to you today. Your physician recommends that you return for lab work TODAY (MAG, BMET, [place]) Your physician wants you to follow-up in: [place].  You will receive a reminder letter in the mail two months in advance. If you don't receive a letter, please call our office to schedule the follow-up appointment.

## 2017-05-03 ENCOUNTER — Other Ambulatory Visit: Payer: Self-pay

## 2017-05-03 MED ORDER — AMLODIPINE BESYLATE 5 MG PO TABS: 5.0000 mg | ORAL_TABLET | Freq: Every day | ORAL | 3 refills | Status: DC

## 2017-10-30 ENCOUNTER — Ambulatory Visit (HOSPITAL_BASED_OUTPATIENT_CLINIC_OR_DEPARTMENT_OTHER)
Admission: RE | Admit: 2017-10-30 | Discharge: 2017-10-30 | Disposition: A | Discharge status: Home / Self Care | Payer: PRIVATE HEALTH INSURANCE | Source: Ambulatory Visit | Attending: Medical | Admitting: Medical

## 2017-10-30 ENCOUNTER — Ambulatory Visit: Payer: Self-pay | Admitting: *Deleted

## 2017-10-30 ENCOUNTER — Encounter: Payer: Self-pay | Admitting: Medical

## 2017-10-30 ENCOUNTER — Ambulatory Visit (INDEPENDENT_AMBULATORY_CARE_PROVIDER_SITE_OTHER): Payer: PRIVATE HEALTH INSURANCE | Admitting: Medical

## 2017-10-30 ENCOUNTER — Ambulatory Visit: Payer: PRIVATE HEALTH INSURANCE | Admitting: Medical

## 2017-10-30 VITALS — BP 142/80 | HR 78 | Temp 98.1°F | Resp 16 | Ht 65.0 in | Wt 218.0 lb

## 2017-10-30 DIAGNOSIS — M25562 Pain in left knee: Secondary | ICD-10-CM

## 2017-10-30 DIAGNOSIS — M79609 Pain in unspecified limb: Secondary | ICD-10-CM

## 2017-10-30 DIAGNOSIS — M1712 Unilateral primary osteoarthritis, left knee: Secondary | ICD-10-CM | POA: Diagnosis not present

## 2017-10-30 MED ORDER — KETOROLAC TROMETHAMINE 30 MG/ML IJ SOLN
30.0000 mg | Freq: Once | INTRAMUSCULAR | Status: AC
  Administered 2017-10-30: 30 mg via INTRAMUSCULAR

## 2017-10-30 MED ORDER — DICLOFENAC SODIUM 75 MG PO TBEC: 75.0000 mg | DELAYED_RELEASE_TABLET | Freq: Two times a day (BID) | ORAL | 0 refills | Status: DC

## 2017-10-30 MED ORDER — TRAMADOL HCL 50 MG PO TABS: 50.0000 mg | ORAL_TABLET | Freq: Three times a day (TID) | ORAL | 0 refills | Status: DC | PRN

## 2017-10-30 NOTE — Progress Notes (Signed)
Subjective:    Patient ID: [name], female    DOB: Jan 15, 1976, 42 y.o.   MRN: [id number]  HPI  Lt knee pain off and on for about 2 weeks. No fall or trauma. Pt states she felt/heard popping to her left knee yesterday walking. Pt has some pretibial leg pain. Leg feels little heavy. Some left ankle swellling but no pain in ankle.  No shortness of breath or wheezing reported.  LMP- October 14, 2017.     Review of Systems  Constitutional: Negative for chills, fatigue and fever.  Respiratory: Negative for cough, chest tightness, shortness of breath and wheezing.   Cardiovascular: Negative for chest pain and palpitations.  Gastrointestinal: Negative for abdominal pain.  Musculoskeletal: Negative for back pain and gait problem.       See HPI.  Skin: Negative for rash.  Neurological: Negative for dizziness, syncope, speech difficulty, weakness, light-headedness, numbness and headaches.  Hematological: Negative for adenopathy. Does not bruise/bleed easily.  Psychiatric/Behavioral: Negative for behavioral problems and confusion.    Past Medical History:  Diagnosis Date  . Beta thalassemia trait   . Essential hypertension, benign 06/12/2012   [facility] note also started on two  new meds for this see med list.  . No pertinent past medical history      Social History   Socioeconomic History  . Marital status: Married    Spouse name: Not on file  . Number of children: Not on file  . Years of education: Not on file  . Highest education level: Not on file  Social Needs  . Financial resource strain: Not on file  . Food insecurity - worry: Not on file  . Food insecurity - inability: Not on file  . Transportation needs - medical: Not on file  . Transportation needs - non-medical: Not on file  Occupational History  . Not on file  Tobacco Use  . Smoking status: Never Smoker  . Smokeless tobacco: Never Used  Substance and Sexual Activity  .  Alcohol use: No  . Drug use: No  . Sexual activity: Yes  Other Topics Concern  . Not on file  Social History Narrative  . Not on file    Past Surgical History:  Procedure Laterality Date  . CESAREAN SECTION    . CESAREAN SECTION  04/17/2012   Procedure: CESAREAN SECTION;  Surgeon: Shelly Bombard, MD;  Location: [facility];  Service: Gynecology;  Laterality: N/A;    Family History  Problem Relation Age of Onset  . Sickle cell anemia Brother   . Hypertension Mother     Allergies  Allergen Reactions  . Amoxicillin Rash    Current Outpatient Medications on File Prior to Visit  Medication Sig Dispense Refill  . metoprolol succinate (TOPROL-XL) 25 MG 24 hr tablet Take 1 tablet (25 mg total) by mouth daily. 90 tablet 3  . Multiple Vitamin (MULTIVITAMIN) tablet Take 1 tablet by mouth daily.    Marland Kitchen amLODipine (NORVASC) 5 MG tablet Take 1 tablet (5 mg total) by mouth daily. 90 tablet 3   No current facility-administered medications on file prior to visit.     BP (!) 142/80   Pulse 78   Temp 98.1 F (36.7 C) (Oral)   Resp 16   Ht 5' 5"  (1.651 m)   Wt 218 lb (98.9 kg)   LMP 10/14/2017   SpO2 100%   BMI 36.28 kg/m       Objective:   Physical  Exam  General- No acute distress. Pleasant patient. Neck- Full range of motion, no jvd Lungs- Clear, even and unlabored. Heart- regular rate and rhythm. Neurologic- CNII- XII grossly intact.  Left knee-tenderness to palpation over the lateral and medial tibial plateau region.  More pain medial aspect.  On flexion-extension no obvious crepitus.  No instability.  Left lower extremity-calves are symmetric on inspection.  No obvious pedal edema.  Did have positive Homans sign.  Some small varicose veins but none dilated or feel thrombosed.      Assessment & Plan:  For your recent left knee pain and popliteal pain, I put in x-ray order an ultrasound of the left lower extremity.   Please get the x-ray done today.  Tomorrow you are  scheduled to get the left lower extremity ultrasound at 830.  You declined later appointment today.  If you were to get any shortness of breath with pain behind your legs then you would need to go to the emergency department today for  ultrasound or other studies.  We gave you Toradol 30 mg injection today to help with your knee pain.  Also I am sending in diclofenac that you can start tomorrow provided your [place] is negative for dvt.  Description of tramadol written for moderate to severe pain.  Follow-up in 7-10 days or as needed.

## 2017-10-30 NOTE — Patient Instructions (Addendum)
For your recent left knee pain and popliteal pain, I put in x-ray order an ultrasound of the left lower extremity.   Please get the x-ray done today.  Tomorrow you are scheduled to get the left lower extremity ultrasound at 830.  You declined later appointment today.  If you were to get any shortness of breath with pain behind your legs then you would need to go to the emergency department today for ultra sound or other studies.  We gave you Toradol 30 mg injection today to help with your knee pain.  Also I am sending in diclofenac that you can start tomorrow provided your [place] is negative for dvt.  Description of tramadol written for moderate to severe pain.  Follow-up in 7-10 days or as needed.

## 2017-10-30 NOTE — Telephone Encounter (Signed)
Pt reports left knee pain x 2 weeks. "Dull at first" worsening, 7/10, "shooting at times." Pain increases with weight bearing.  Reports "mild" swelling at knee, "maybe a little at foot." Pain is at knee, down to ankle, difficult to walk at times.  Denies any calf pain, redness. Pt has tried IBU, ice, heat, rest, Biofreeze. Helps "a little, comes right back." States no injury. Appt made for today with E. Saguier. Care advice given per protocol.    Reason for Disposition . [1] MODERATE pain (e.g., interferes with normal activities, limping) AND [2] present > 3 days  Answer Assessment - Initial Assessment Questions 1. LOCATION and RADIATION: "Where is the pain located?"      Left knee, down front of leg to ankle 2. QUALITY: "What does the pain feel like?"  (e.g., sharp, dull, aching, burning)     Dull all the time, sharp at times 7/10, worse with weight bearing 3. SEVERITY: "How bad is the pain?" "What does it keep you from doing?"   (Scale 1-10; or mild, moderate, severe)   -  MILD (1-3): doesn't interfere with normal activities    -  MODERATE (4-7): interferes with normal activities (e.g., work or school) or awakens from sleep, limping    -  SEVERE (8-10): excruciating pain, unable to do any normal activities, unable to walk     Moderate. 7/10 4. ONSET: "When did the pain start?" "Does it come and go, or is it there all the time?"     2 weeks ago started dull, worsening 5. RECURRENT: "Have you had this pain before?" If so, ask: "When, and what happened then?"     No 6. SETTING: "Has there been any recent work, exercise or other activity that involved that part of the body?"      No 7. AGGRAVATING FACTORS: "What makes the knee pain worse?" (e.g., walking, climbing stairs, running)    Walking 8. ASSOCIATED SYMPTOMS: "Is there any swelling or redness of the knee?"     "Some swelling" 9. OTHER SYMPTOMS: "Do you have any other symptoms?" (e.g., chest pain, difficulty breathing, fever, calf pain)     No 10. PREGNANCY: "Is there any chance you are pregnant?" "When was your last menstrual period?"       No  Protocols used: KNEE PAIN-A-AH

## 2017-10-31 ENCOUNTER — Ambulatory Visit (HOSPITAL_BASED_OUTPATIENT_CLINIC_OR_DEPARTMENT_OTHER)
Admission: RE | Admit: 2017-10-31 | Discharge: 2017-10-31 | Disposition: A | Discharge status: Home / Self Care | Payer: PRIVATE HEALTH INSURANCE | Source: Ambulatory Visit | Attending: Medical | Admitting: Medical

## 2017-10-31 DIAGNOSIS — M79605 Pain in left leg: Secondary | ICD-10-CM | POA: Diagnosis not present

## 2017-10-31 DIAGNOSIS — M79609 Pain in unspecified limb: Secondary | ICD-10-CM

## 2018-02-21 ENCOUNTER — Other Ambulatory Visit: Payer: Self-pay | Admitting: Internal Medicine

## 2018-05-30 ENCOUNTER — Other Ambulatory Visit: Payer: Self-pay | Admitting: Internal Medicine

## 2018-06-01 ENCOUNTER — Ambulatory Visit: Payer: PRIVATE HEALTH INSURANCE | Admitting: Internal Medicine

## 2018-06-01 ENCOUNTER — Encounter: Payer: Self-pay | Admitting: Internal Medicine

## 2018-06-01 VITALS — BP 134/76 | HR 80 | Ht 65.0 in | Wt 219.0 lb

## 2018-06-01 DIAGNOSIS — R011 Cardiac murmur, unspecified: Secondary | ICD-10-CM

## 2018-06-01 DIAGNOSIS — R002 Palpitations: Secondary | ICD-10-CM

## 2018-06-01 DIAGNOSIS — I10 Essential (primary) hypertension: Secondary | ICD-10-CM

## 2018-06-01 MED ORDER — CARVEDILOL 6.25 MG PO TABS: 6.2500 mg | ORAL_TABLET | Freq: Two times a day (BID) | ORAL | 3 refills | Status: DC

## 2018-06-01 NOTE — Patient Instructions (Addendum)
Medication Instructions: Your physician has recommended you make the following change in your medication: add Coreg (Carvedilol).. 6.12m by mouth twice a day...only take 1/2... 3.1269mtwice a day for 2 days. Stop Metoprolol.     Labwork: none ordered    Testing/Procedures: None ordered     Follow-Up: Your physician wants you to follow-up in: one year with Dr. RoHarrington Challenger.Marland KitchenMarland Kitchenu will receive a reminder letter in the mail two months in advance. If you don't receive a letter, please call our office to schedule the follow-up appointment.     Any Other Special Instructions Will Be Listed Below (If Applicable).     If you need a refill on your cardiac medications before your next appointment, please call your pharmacy.

## 2018-06-01 NOTE — Progress Notes (Signed)
Cardiology Office Note   Date:  06/01/2018   ID:  CALE DECAROLIS, DOB [birth year], MRN [id number]  PCP:  Darreld Mclean, MD  Cardiologist:   Dorris Carnes, MD   F/U for palpitations     History of Present Illness: [name] is a 42 y.o. female with a history of palpitations and murmur  I saw her for the first time in May 2018 When I saw her I added Toprol XL to regimn 25  Recomm f/u BP I did not hear a murmur  REcomm echo  This showed normal LV function  Normal valve function    Deniese palpitations   Says she is tired   Wants to sleep  Does not work out   [employer] a lot at work  BP at work when she takes is runs 140 t0 145   Not truly at rest    Current Meds  Medication Sig  . amLODipine (NORVASC) 5 MG tablet TAKE 1 TABLET(5 MG) BY MOUTH DAILY  . diclofenac (VOLTAREN) 75 MG EC tablet Take 1 tablet (75 mg total) by mouth 2 (two) times daily.  . metoprolol succinate (TOPROL-XL) 25 MG 24 hr tablet TAKE 1 TABLET BY MOUTH DAILY  . Multiple Vitamin (MULTIVITAMIN) tablet Take 1 tablet by mouth daily.     Allergies:   Amoxicillin   Past Medical History:  Diagnosis Date  . Beta thalassemia trait   . Essential hypertension, benign 06/12/2012   [facility] abstract note also started on two  new meds for this see med list.  . No pertinent past medical history     Past Surgical History:  Procedure Laterality Date  . CESAREAN SECTION    . CESAREAN SECTION  04/17/2012   Procedure: CESAREAN SECTION;  Surgeon: Shelly Bombard, MD;  Location: [facility];  Service: Gynecology;  Laterality: N/A;     Social History:  The patient  reports that she has never smoked. She has never used smokeless tobacco. She reports that she does not drink alcohol or use drugs.   Family History:  The patient's family history includes Hypertension in her mother; Sickle cell anemia in her brother.    ROS:  Please see the history of present illness. All other systems are reviewed  and  Negative to the above problem except as noted.    PHYSICAL EXAM: VS:  BP 134/76   Pulse 80   Ht 5' 5"  (1.651 m)   Wt 219 lb (99.3 kg)   SpO2 97%   BMI 36.44 kg/m   BP on my check 136/82 GEN: Morbidly obese 42 yo in no acute distress  HEENT: normal  Neck: JVP is normal   carotid bruits, or masses Cardiac: RRR;  Gr I/VI systolic murmur , rubs, or gallops,no edema  Respiratory:  clear to auscultation bilaterally, normal work of breathing GI: soft, nontender, nondistended, + BS  No hepatomegaly  MS: no deformity Moving all extremities   Skin: warm and dry, no rash Neuro:  Strength and sensation are intact Psych: euthymic mood, full affect   EKG:  EKG is not oredered today     Lipid Panel    Component Value Date/Time   CHOL 170 05/01/2017 1111   TRIG 61 05/01/2017 1111   HDL 59 05/01/2017 1111   CHOLHDL 2.9 05/01/2017 1111   CHOLHDL 2.9 03/23/2015 1127   VLDL 13 03/23/2015 1127   LDLCALC 99 05/01/2017 1111      Wt Readings from Last  3 Encounters:  06/01/18 219 lb (99.3 kg)  10/30/17 218 lb (98.9 kg)  05/01/17 216 lb 12 oz (98.3 kg)      ASSESSMENT AND PLAN:  1  Palpitations  Improved  Continue b blocker  [facility] Headaches will switch to coreg 6.25 bid Follow response     2  HTBP is not optimal   Follow with change to coreg Start 3.125 bid and increase to 6.25    Pt to call in with response    3   Murmur  Echo with no valvular dz  Murmur normal flow.  4  HCM  Lipids awere excellent 1 year ago    5  Obesity  Discussed wt loss   Diet   Exercise     Pt to write in  With response  To change in meds   F/U next fall Current medicines are reviewed at length with the patient today.  The patient does not have concerns regarding medicines.  Signed, Dorris Carnes, MD  06/01/2018 10:15 AM    [address], [place], [place]  [address] Phone: [phone]; Fax: [phone]

## 2018-06-12 ENCOUNTER — Ambulatory Visit (INDEPENDENT_AMBULATORY_CARE_PROVIDER_SITE_OTHER): Payer: PRIVATE HEALTH INSURANCE | Admitting: Medical

## 2018-06-12 ENCOUNTER — Encounter: Payer: Self-pay | Admitting: Medical

## 2018-06-12 VITALS — BP 140/82 | HR 90 | Temp 98.5°F | Resp 18 | Ht 65.0 in | Wt 219.0 lb

## 2018-06-12 DIAGNOSIS — H6690 Otitis media, unspecified, unspecified ear: Secondary | ICD-10-CM | POA: Diagnosis not present

## 2018-06-12 DIAGNOSIS — J0100 Acute maxillary sinusitis, unspecified: Secondary | ICD-10-CM | POA: Diagnosis not present

## 2018-06-12 DIAGNOSIS — R6889 Other general symptoms and signs: Secondary | ICD-10-CM

## 2018-06-12 LAB — POC INFLUENZA A&B (BINAX/QUICKVUE)
Influenza A, POC: NEGATIVE
Influenza B, POC: NEGATIVE

## 2018-06-12 MED ORDER — AZITHROMYCIN 250 MG PO TABS: ORAL_TABLET | ORAL | 0 refills | Status: DC

## 2018-06-12 MED ORDER — BENZONATATE 100 MG PO CAPS: 100.0000 mg | ORAL_CAPSULE | Freq: Three times a day (TID) | ORAL | 0 refills | Status: DC | PRN

## 2018-06-12 MED ORDER — OSELTAMIVIR PHOSPHATE 75 MG PO CAPS: 75.0000 mg | ORAL_CAPSULE | Freq: Two times a day (BID) | ORAL | 0 refills | Status: DC

## 2018-06-12 MED ORDER — FLUTICASONE PROPIONATE 50 MCG/ACT NA SUSP: 2.0000 | Freq: Every day | NASAL | 1 refills | Status: DC

## 2018-06-12 NOTE — Patient Instructions (Addendum)
You have had some recent  exposure to potential flu through your son and you do have some flulike symptoms including diffuse severe myalgias and arthralgias.  You would be at the upper treatment timeframe so I would go ahead and start Tamiflu today.  Your rapid flu test was negative but sometimes rapid test can be falsely negative.  On exam you appear to also have some secondary sinus infection, right ear pain and your throat is mildly bright red as well.  I will go ahead and prescribe azithromycin antibiotic.  For nasal congestion I prescribed Flonase and for cough making benzonatate available.  Can return to work on October 10 provided you feel better and have no fever.  Work note given.  Follow-up in 7 to 10 days or as needed.

## 2018-06-12 NOTE — Progress Notes (Signed)
Subjective:    Patient ID: [name], female    DOB: 06/26/1976, 42 y.o.   MRN: [id number]  HPI  Pt in for some recent nasal congestion, sinus pressure, dry cough, chest congestion, frontal sinus pressure and diffuse body aches. She has some chills as well. Body aches started 2 days ago. Other symptom started about 4-5 days ago.  Son had flu like symptom and was treated for flu. Pt states clinic her son went to did not do flu test but treated for the flu.  Mild st.   LMP- 3 weeks ago.   Review of Systems  Constitutional: Positive for fatigue. Negative for chills and fever.  HENT: Positive for congestion, sinus pressure, sinus pain and sore throat. Negative for ear discharge and facial swelling.   Respiratory: Positive for cough. Negative for chest tightness, shortness of breath and wheezing.   Cardiovascular: Negative for chest pain and palpitations.  Gastrointestinal: Negative for abdominal pain.  Musculoskeletal: Positive for arthralgias and myalgias.   Past Medical History:  Diagnosis Date  . Beta thalassemia trait   . Essential hypertension, benign 06/12/2012   [facility] abstract note also started on two  new meds for this see med list.  . No pertinent past medical history      Social History   Socioeconomic History  . Marital status: Married    Spouse name: Not on file  . Number of children: Not on file  . Years of education: Not on file  . Highest education level: Not on file  Occupational History  . Not on file  Social Needs  . Financial resource strain: Not on file  . Food insecurity:    Worry: Not on file    Inability: Not on file  . Transportation needs:    Medical: Not on file    Non-medical: Not on file  Tobacco Use  . Smoking status: Never Smoker  . Smokeless tobacco: Never Used  Substance and Sexual Activity  . Alcohol use: No  . Drug use: No  . Sexual activity: Yes  Lifestyle  . Physical activity:    Days per week:  Not on file    Minutes per session: Not on file  . Stress: Not on file  Relationships  . Social connections:    Talks on phone: Not on file    Gets together: Not on file    Attends religious service: Not on file    Active member of club or organization: Not on file    Attends meetings of clubs or organizations: Not on file    Relationship status: Not on file  . Intimate partner violence:    Fear of current or ex partner: Not on file    Emotionally abused: Not on file    Physically abused: Not on file    Forced sexual activity: Not on file  Other Topics Concern  . Not on file  Social History Narrative  . Not on file    Past Surgical History:  Procedure Laterality Date  . CESAREAN SECTION    . CESAREAN SECTION  04/17/2012   Procedure: CESAREAN SECTION;  Surgeon: Shelly Bombard, MD;  Location: [facility];  Service: Gynecology;  Laterality: N/A;    Family History  Problem Relation Age of Onset  . Sickle cell anemia Brother   . Hypertension Mother     Allergies  Allergen Reactions  . Amoxicillin Rash    Current Outpatient Medications on File Prior to Visit  Medication Sig Dispense Refill  . amLODipine (NORVASC) 5 MG tablet TAKE 1 TABLET(5 MG) BY MOUTH DAILY 90 tablet 0  . carvedilol (COREG) 6.25 MG tablet Take 1 tablet (6.25 mg total) by mouth 2 (two) times daily. 180 tablet 3  . diclofenac (VOLTAREN) 75 MG EC tablet Take 1 tablet (75 mg total) by mouth 2 (two) times daily. 30 tablet 0  . Multiple Vitamin (MULTIVITAMIN) tablet Take 1 tablet by mouth daily.     No current facility-administered medications on file prior to visit.     BP 140/82 (BP Location: Right Arm, Patient Position: Sitting, Cuff Size: Large)   Pulse 90   Temp 98.5 F (36.9 C) (Oral)   Resp 18   Ht 5' 5"  (1.651 m)   Wt 219 lb (99.3 kg)   SpO2 98%   BMI 36.44 kg/m       Objective:   Physical Exam  General  Mental Status - Alert. General Appearance - Well groomed. Not in acute  distress.  Skin Rashes- No Rashes.  HEENT Head- Normal. Ear Auditory Canal - Left- Normal. Right - Normal.Tympanic Membrane- Left- Normal. Right- red Eye Sclera/Conjunctiva- Left- Normal. Right- Normal. Nose & Sinuses Nasal Mucosa- Left-  Boggy and Congested. Right-  Boggy and  Congested.Bilateral maxillary and frontal sinus pressure. Mouth & Throat Lips: Upper Lip- Normal: no dryness, cracking, pallor, cyanosis, or vesicular eruption. Lower Lip-Normal: no dryness, cracking, pallor, cyanosis or vesicular eruption. Buccal Mucosa- Bilateral- No Aphthous ulcers. Oropharynx- No Discharge or Erythema. Tonsils: Characteristics- Bilateral- mild Erythema. Size/Enlargement- Bilateral- No enlargement. Discharge- bilateral-None.  Neck Neck- Supple. No Masses.   Chest and Lung Exam Auscultation: Breath Sounds:-Clear even and unlabored.  Cardiovascular Auscultation:Rythm- Regular, rate and rhythm. Murmurs & Other Heart Sounds:Ausculatation of the heart reveal- No Murmurs.  Lymphatic Head & Neck General Head & Neck Lymphatics: Bilateral: Description- No Localized lymphadenopathy.        Assessment & Plan:  You have had some recent  exposure to potential flu through your son and you do have some flulike symptoms including diffuse severe myalgias and arthralgias.  You would be at the upper treatment timeframe so I would go ahead and start Tamiflu today.  Your rapid flu test was negative but sometimes rapid test can be falsely negative.  On exam you appear to also have some secondary sinus infection, right ear pain and your throat is mildly bright red as well.  I will go ahead and prescribe azithromycin antibiotic.  For nasal congestion I prescribed Flonase and for cough making benzonatate available.  Can return to work on October 10 provided you feel better and have no fever.  Work note given.  Follow-up in 7 to 10 days or as needed  [employer], [employer]

## 2018-10-08 ENCOUNTER — Other Ambulatory Visit: Payer: Self-pay | Admitting: Internal Medicine

## 2019-06-10 ENCOUNTER — Other Ambulatory Visit: Payer: Self-pay

## 2019-06-11 ENCOUNTER — Other Ambulatory Visit: Payer: Self-pay | Admitting: Internal Medicine

## 2019-06-11 NOTE — Progress Notes (Deleted)
[facility] [address], [place], [place] [address] [phone]  Date:  06/12/2019   Name:  [name]   DOB:  Sep 22, 1975   MRN:  [id number]  PCP:  Darreld Mclean, MD    Chief Complaint: No chief complaint on file.   History of Present Illness:  [name] is a 43 y.o. very pleasant female patient who presents with the following:  Office visit today to discuss left ear pain Patient with hypertension, beta thalassemia trait  Last seen by myself in 2016  Patient Active Problem List   Diagnosis Date Noted  . Cough 09/14/2016  . Heart murmur 09/02/2015  . Benign essential HTN 03/23/2015  . 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 Elderly multigravida with antepartum condition or complication 03/16/1974  . Beta thalassemia trait 10/11/2011    Past Medical History:  Diagnosis Date  . Beta thalassemia trait   . Essential hypertension, benign 06/12/2012   [facility] abstract note also started on two  new meds for this see med list.  . No pertinent past medical history     Past Surgical History:  Procedure Laterality Date  . CESAREAN SECTION    . CESAREAN SECTION  04/17/2012   Procedure: CESAREAN SECTION;  Surgeon: Shelly Bombard, MD;  Location: [facility];  Service: Gynecology;  Laterality: N/A;    Social History   Tobacco Use  . Smoking status: Never Smoker  . Smokeless tobacco: Never Used  Substance Use Topics  . Alcohol use: No  . Drug use: No    Family History  Problem Relation Age of Onset  . Sickle cell anemia Brother   . Hypertension Mother     Allergies  Allergen Reactions  . Amoxicillin Rash    Medication list has been reviewed and updated.  Current Outpatient Medications on File Prior to Visit  Medication Sig Dispense Refill  . amLODipine (NORVASC) 5 MG tablet TAKE 1 TABLET(5 MG) BY MOUTH DAILY 90 tablet 3  . azithromycin (ZITHROMAX) 250 MG  tablet Take 2 tablets by mouth on day 1, followed by 1 tablet by mouth daily for 4 days. 6 tablet 0  . benzonatate (TESSALON) 100 MG capsule Take 1 capsule (100 mg total) by mouth 3 (three) times daily as needed for cough. 30 capsule 0  . carvedilol (COREG) 6.25 MG tablet Take 1 tablet (6.25 mg total) by mouth 2 (two) times daily. 180 tablet 3  . diclofenac (VOLTAREN) 75 MG EC tablet Take 1 tablet (75 mg total) by mouth 2 (two) times daily. 30 tablet 0  . fluticasone (FLONASE) 50 MCG/ACT nasal spray Place 2 sprays into both nostrils daily. 16 g 1  . Multiple Vitamin (MULTIVITAMIN) tablet Take 1 tablet by mouth daily.    Marland Kitchen oseltamivir (TAMIFLU) 75 MG capsule Take 1 capsule (75 mg total) by mouth 2 (two) times daily. 10 capsule 0   No current facility-administered medications on file prior to visit.     Review of Systems:  As per HPI- otherwise negative.   Physical Examination: There were no vitals filed for this visit. There were no vitals filed for this visit. There is no height or weight on file to calculate BMI. Ideal Body Weight:    GEN: WDWN, NAD, Non-toxic, A & O x 3 HEENT: Atraumatic, Normocephalic. Neck supple. No masses, No LAD. Ears and Nose: No external deformity. CV: RRR, No M/G/R. No JVD. No  thrill. No extra heart sounds. PULM: CTA B, no wheezes, crackles, rhonchi. No retractions. No resp. distress. No accessory muscle use. ABD: S, NT, ND, +BS. No rebound. No HSM. EXTR: No c/c/e NEURO Normal gait.  PSYCH: Normally interactive. Conversant. Not depressed or anxious appearing.  Calm demeanor.    Assessment and Plan: ***  Signed Lamar Blinks, MD

## 2019-06-12 ENCOUNTER — Ambulatory Visit: Payer: PRIVATE HEALTH INSURANCE | Admitting: Family Medicine

## 2019-06-17 ENCOUNTER — Encounter: Payer: Self-pay | Admitting: Family Medicine

## 2019-06-17 ENCOUNTER — Ambulatory Visit (HOSPITAL_BASED_OUTPATIENT_CLINIC_OR_DEPARTMENT_OTHER)
Admission: RE | Admit: 2019-06-17 | Discharge: 2019-06-17 | Disposition: A | Discharge status: Home / Self Care | Payer: PRIVATE HEALTH INSURANCE | Source: Ambulatory Visit | Attending: Family Medicine | Admitting: Family Medicine

## 2019-06-17 ENCOUNTER — Ambulatory Visit (INDEPENDENT_AMBULATORY_CARE_PROVIDER_SITE_OTHER): Payer: PRIVATE HEALTH INSURANCE | Admitting: Family Medicine

## 2019-06-17 ENCOUNTER — Other Ambulatory Visit: Payer: Self-pay

## 2019-06-17 VITALS — BP 130/88 | HR 70 | Temp 98.0°F | Resp 16 | Ht 65.0 in | Wt 230.0 lb

## 2019-06-17 DIAGNOSIS — R350 Frequency of micturition: Secondary | ICD-10-CM

## 2019-06-17 DIAGNOSIS — Z130 Encounter for screening for diseases of the blood and blood-forming organs and certain disorders involving the immune mechanism: Secondary | ICD-10-CM

## 2019-06-17 DIAGNOSIS — Z13220 Encounter for screening for lipoid disorders: Secondary | ICD-10-CM

## 2019-06-17 DIAGNOSIS — J029 Acute pharyngitis, unspecified: Secondary | ICD-10-CM

## 2019-06-17 DIAGNOSIS — Z131 Encounter for screening for diabetes mellitus: Secondary | ICD-10-CM

## 2019-06-17 DIAGNOSIS — R079 Chest pain, unspecified: Secondary | ICD-10-CM

## 2019-06-17 DIAGNOSIS — H9202 Otalgia, left ear: Secondary | ICD-10-CM

## 2019-06-17 LAB — LIPID PANEL
Cholesterol: 159 mg/dL (ref 0–200)
HDL: 42.5 mg/dL (ref >39.00)
LDL Cholesterol: 89 mg/dL (ref 0–99)
NonHDL: 116.06
Total CHOL/HDL Ratio: 4
Triglycerides: 133 mg/dL (ref 0.0–149.0)
VLDL: 26.6 mg/dL (ref 0.0–40.0)

## 2019-06-17 LAB — URINALYSIS, MICROSCOPIC ONLY: RBC / HPF: NONE SEEN (ref 0–?)

## 2019-06-17 LAB — POC URINALSYSI DIPSTICK (AUTOMATED)
Bilirubin, UA: NEGATIVE
Glucose, UA: NEGATIVE
Ketones, UA: NEGATIVE
Leukocytes, UA: NEGATIVE
Nitrite, UA: NEGATIVE
Protein, UA: NEGATIVE
Spec Grav, UA: 1.015 (ref 1.010–1.025)
Urobilinogen, UA: 0.2 E.U./dL
pH, UA: 7 (ref 5.0–8.0)

## 2019-06-17 LAB — COMPREHENSIVE METABOLIC PANEL
ALT: 9 U/L (ref 0–35)
AST: 13 U/L (ref 0–37)
Albumin: 4.1 g/dL (ref 3.5–5.2)
Alkaline Phosphatase: 49 U/L (ref 39–117)
BUN: 11 mg/dL (ref 6–23)
CO2: 25 mEq/L (ref 19–32)
Calcium: 9.6 mg/dL (ref 8.4–10.5)
Chloride: 105 mEq/L (ref 96–112)
Creatinine, Ser: 0.74 mg/dL (ref 0.40–1.20)
GFR: 102.47 mL/min (ref >60.00)
Glucose, Bld: 104 mg/dL — ABNORMAL HIGH (ref 70–99)
Potassium: 3.5 mEq/L (ref 3.5–5.1)
Sodium: 138 mEq/L (ref 135–145)
Total Bilirubin: 0.4 mg/dL (ref 0.2–1.2)
Total Protein: 7.4 g/dL (ref 6.0–8.3)

## 2019-06-17 LAB — TROPONIN I (HIGH SENSITIVITY): High Sens Troponin I: <2 ng/L (ref 2–17)

## 2019-06-17 LAB — CBC
HCT: 35.5 % — ABNORMAL LOW (ref 36.0–46.0)
Hemoglobin: 11.1 g/dL — ABNORMAL LOW (ref 12.0–15.0)
MCHC: 31.3 g/dL (ref 30.0–36.0)
MCV: 67.4 fl — ABNORMAL LOW (ref 78.0–100.0)
Platelets: 245 10*3/uL (ref 150.0–400.0)
RBC: 5.27 Mil/uL — ABNORMAL HIGH (ref 3.87–5.11)
RDW: 17.8 % — ABNORMAL HIGH (ref 11.5–15.5)
WBC: 4.8 10*3/uL (ref 4.0–10.5)

## 2019-06-17 LAB — POCT RAPID STREP A (OFFICE): Rapid Strep A Screen: NEGATIVE

## 2019-06-17 LAB — HEMOGLOBIN A1C: Hgb A1c MFr Bld: 6.9 % — ABNORMAL HIGH (ref 4.6–6.5)

## 2019-06-17 LAB — POCT URINE PREGNANCY: Preg Test, Ur: NEGATIVE

## 2019-06-17 MED ORDER — DOXYCYCLINE HYCLATE 100 MG PO TABS: 100.0000 mg | ORAL_TABLET | Freq: Two times a day (BID) | ORAL | 0 refills | Status: DC

## 2019-06-17 NOTE — Progress Notes (Addendum)
[place] [facility] [address] [place], [place] [address] [phone] [id number]  Date:  06/17/2019   Name:  [name]   DOB:  08-13-1976   MRN:  [id number]  PCP:  Darreld Mclean, MD    Chief Complaint: Ear Pain (left ear pain, taking ibuprofen, couple of weeks) and Urinary Tract Infection (frequency)   History of Present Illness:  [name] is a 43 y.o. very pleasant female patient who presents with the following:  Patient with history of hypertension, beta thalassemia trait.  Here today with concern of ear pain Last seen in the office about 1 year ago by Percell Miller for sick visit I have actually not seen her since 2016  Last week she noted a sore throat - this is now resolved, but her left ear may still hurt a bit when she swallows No fever  No cough or chills  She also notes some possible UTI sx- she has been trying to hydrate and treat herself She notes urinary frequency but no pain No hematuria She has noted a little back pain but no belly pain No vomiting or diarrhea  Flu shot- give today  Pap LMP 05/21/2019-she has start soon  She does see Dr Harrington Challenger for cardiology care- last seen by her about a year ago She had an echo in 2018 which was normal  Patient then also mentions a tertiary complaint- She also has noted a pain in her left ribs when she takes a deep breath for about one week.  She says that this may feel like chest pain, and may cause shortness of breath when the pain hits her.  The pain generally lasts just a moment or 2.  It is not exertional, may be associated with twisting her trunk or lifting an object She is not aware of any injury No history of DVT or PE  Patient Active Problem List   Diagnosis Date Noted  . Cough 09/14/2016  . Heart murmur 09/02/2015  . Benign essential HTN 03/23/2015  . 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 Elderly multigravida with antepartum condition or  complication 22/[id number]/9892  . Beta thalassemia trait 10/11/2011    Past Medical History:  Diagnosis Date  . Beta thalassemia trait   . Essential hypertension, benign 06/12/2012   [facility] abstract note also started on two  new meds for this see med list.  . No pertinent past medical history     Past Surgical History:  Procedure Laterality Date  . CESAREAN SECTION    . CESAREAN SECTION  04/17/2012   Procedure: CESAREAN SECTION;  Surgeon: Shelly Bombard, MD;  Location: [facility];  Service: Gynecology;  Laterality: N/A;    Social History   Tobacco Use  . Smoking status: Never Smoker  . Smokeless tobacco: Never Used  Substance Use Topics  . Alcohol use: No  . Drug use: No    Family History  Problem Relation Age of Onset  . Sickle cell anemia Brother   . Hypertension Mother     Allergies  Allergen Reactions  . Amoxicillin Rash    Medication list has been reviewed and updated.  Current Outpatient Medications on File Prior to Visit  Medication Sig Dispense Refill  . amLODipine (NORVASC) 5 MG tablet TAKE 1 TABLET(5 MG) BY MOUTH DAILY 90 tablet 3  . carvedilol (COREG) 6.25 MG tablet TAKE 1 TABLET(6.25 MG) BY MOUTH TWICE DAILY 180 tablet 3  .  diclofenac (VOLTAREN) 75 MG EC tablet Take 1 tablet (75 mg total) by mouth 2 (two) times daily. 30 tablet 0  . fluticasone (FLONASE) 50 MCG/ACT nasal spray Place 2 sprays into both nostrils daily. 16 g 1  . Multiple Vitamin (MULTIVITAMIN) tablet Take 1 tablet by mouth daily.     No current facility-administered medications on file prior to visit.     Review of Systems:  As per HPI- otherwise negative. No fever or chills  Physical Examination: Vitals:   06/17/19 1303 06/17/19 1351  BP: (!) 148/80 130/88  Pulse: 70   Resp: 16   Temp: 98 F (36.7 C)   SpO2: 98%    Vitals:   06/17/19 1303  Weight: 230 lb (104.3 kg)  Height: 5' 5"  (1.651 m)   Body mass index is 38.27 kg/m. Ideal Body Weight: Weight in (lb)  to have BMI = 25: 149.9  GEN: WDWN, NAD, Non-toxic, A & O x 3, obese, well-appearing  HEENT: Atraumatic, Normocephalic. Neck supple. No masses, No LAD.  Bilateral TM are injected but no effusion, oropharynx normal.  PEERL,EOMI.   Ears and Nose: No external deformity. CV: RRR, No M/G/R. No JVD. No thrill. No extra heart sounds. PULM: CTA B, no wheezes, crackles, rhonchi. No retractions. No resp. distress. No accessory muscle use. ABD: S, NT, ND, +BS. No rebound. No HSM. EXTR: No c/c/e NEURO Normal gait.  PSYCH: Normally interactive. Conversant. Not depressed or anxious appearing.  Calm demeanor.  I am somewhat able to reproduce her symptoms by pressing on her left ribs inferior to the breast.  No skin changes to suggest shingles.  She is not entirely clear on if the pain she feels with palpation is the same pain she notices other times  EKG:NSR, no concerning findings  C/w reading from 2018 no acute change noted  Results for orders placed or performed in visit on 06/17/19  Urine Culture   Specimen: Urine  Result Value Ref Range   MICRO NUMBER: [id number]    SPECIMEN QUALITY: Adequate    Sample Source URINE    STATUS: FINAL    ISOLATE 1:      Growth of mixed flora was isolated, suggesting probable contamination. No further testing will be performed. If clinically indicated, recollection using a method to minimize contamination, with prompt transfer to Urine Culture Transport Tube, is  recommended.   Urine Microscopic Only  Result Value Ref Range   WBC, UA 0-2/hpf 0-2/hpf   RBC / HPF none seen 0-2/hpf   Mucus, UA Presence of (A) None   Squamous Epithelial / LPF Few(5-10/hpf) (A) Rare(0-4/hpf)   Bacteria, UA Rare(<10/hpf) (A) None   Amorphous Present (A) None;Present  CBC  Result Value Ref Range   WBC 4.8 4.0 - 10.5 K/uL   RBC 5.27 (H) 3.87 - 5.11 Mil/uL   Platelets 245.0 150.0 - 400.0 K/uL   Hemoglobin 11.1 (L) 12.0 - 15.0 g/dL   HCT 35.5 (L) 36.0 - 46.0 %   MCV 67.4 (L) 78.0 -  100.0 fl   MCHC 31.3 30.0 - 36.0 g/dL   RDW 17.8 (H) 11.5 - 15.5 %  Comprehensive metabolic panel  Result Value Ref Range   Sodium 138 135 - 145 mEq/L   Potassium 3.5 3.5 - 5.1 mEq/L   Chloride 105 96 - 112 mEq/L   CO2 25 19 - 32 mEq/L   Glucose, Bld 104 (H) 70 - 99 mg/dL   BUN 11 6 - 23 mg/dL   Creatinine, Ser  0.74 0.40 - 1.20 mg/dL   Total Bilirubin 0.4 0.2 - 1.2 mg/dL   Alkaline Phosphatase 49 39 - 117 U/L   AST 13 0 - 37 U/L   ALT 9 0 - 35 U/L   Total Protein 7.4 6.0 - 8.3 g/dL   Albumin 4.1 3.5 - 5.2 g/dL   Calcium 9.6 8.4 - 10.5 mg/dL   GFR 102.47 >60.00 mL/min  Hemoglobin A1c  Result Value Ref Range   Hgb A1c MFr Bld 6.9 (H) 4.6 - 6.5 %  Lipid panel  Result Value Ref Range   Cholesterol 159 0 - 200 mg/dL   Triglycerides 133.0 0.0 - 149.0 mg/dL   HDL 42.50 >39.00 mg/dL   VLDL 26.6 0.0 - 40.0 mg/dL   LDL Cholesterol 89 0 - 99 mg/dL   Total CHOL/HDL Ratio 4    NonHDL 116.06   D-Dimer, Quantitative  Result Value Ref Range   D-Dimer, Quant 0.43 <0.50 mcg/mL FEU  POCT Urinalysis Dipstick (Automated)  Result Value Ref Range   Color, UA yellow    Clarity, UA cloudy    Glucose, UA Negative Negative   Bilirubin, UA Negative    Ketones, UA Negatve    Spec Grav, UA 1.015 1.010 - 1.025   Blood, UA 3+    pH, UA 7.0 5.0 - 8.0   Protein, UA Negative Negative   Urobilinogen, UA 0.2 0.2 or 1.0 E.U./dL   Nitrite, UA Negative    Leukocytes, UA Negative Negative  POCT rapid strep A  Result Value Ref Range   Rapid Strep A Screen Negative Negative  POCT urine pregnancy  Result Value Ref Range   Preg Test, Ur Negative Negative  Troponin I (High Sensitivity)  Result Value Ref Range   High Sens Troponin I <2 2 - 17 ng/L    Assessment and Plan: Urinary frequency - Plan: POCT Urinalysis Dipstick (Automated), Urine Culture, Urine Microscopic Only  Sore throat - Plan: POCT rapid strep A  Left-sided chest pain - Plan: EKG 12-Lead, DG Chest 2 View, POCT urine pregnancy,  D-Dimer, Quantitative, Troponin I (High Sensitivity)  Screening for deficiency anemia - Plan: CBC  Screening for diabetes mellitus - Plan: Comprehensive metabolic panel, Hemoglobin A1c  Screening for hyperlipidemia - Plan: Lipid panel  Left ear pain - Plan: doxycycline (VIBRA-TABS) 100 MG tablet  Here today with several concerns. Sore throat last week is now mostly resolved.  Rapid strep negative Both TMs are injected.  Advised that this is probably viral and will resolve.  She is concerned and would like an antibiotic.  Prescription for doxycycline Urinary frequency-UA is benign except blood.  Suspect this may be beginning of her menstrual cycle.  Asked her to let me know if her period does not appear in the next couple of days.  Send urine for micro and culture Routine labs ordered She notes nonspecific left-sided "rib" pain for about 1 week.  Seems to come and go, is more of a sharp stabbing pain that occurs with a deep breath or movement of her trunk.  Is not exertional.  Discussed this with patient in detail.  While I suspect this is musculoskeletal, heart or lung etiology is also possible.  Offered to have her evaluated in the ER now which would be our most conservative option.  She declines, would like outpatient work-up  EKG is negative Will obtain chest film, D-dimer and troponin  Received her chest x-ray and lab so far, message to patient   I am still  waiting on your urine culture Your blood counts show mild anemia, consistent with your known beta thalassemia trait Metabolic profile is negative Your A1c-average blood sugar the last 3 months-is in the diabetes range.  We would need to confirm this with a second A1c above 6.5% prior to diagnosis of diabetes.  However, I do suspect that you likely have high blood sugar Cholesterol looks okay Your troponin test-looking for cardiac ischemia-is negative.  I am still waiting on your D-dimer  I will be in touch with your D-dimer  ASAP If you have any worsening of your symptoms, or if not feeling better in the next 2 or 3 days please let me know  Please come and see me in about 1 month to recheck your A1c and discuss possible diabetes  Dg Chest 2 View  Result Date: 06/17/2019 CLINICAL DATA:  Left-sided chest pain.  Hypertension. EXAM: CHEST - 2 VIEW COMPARISON:  04/07/2009 FINDINGS: The heart size and mediastinal contours are within normal limits. Both lungs are clear. The visualized skeletal structures are unremarkable. IMPRESSION: No active cardiopulmonary disease. Electronically Signed   By: Marlaine Hind M.D.   On: 06/17/2019 14:18   Results for orders placed or performed in visit on 06/17/19  Urine Culture   Specimen: Urine  Result Value Ref Range   MICRO NUMBER: [id number]    SPECIMEN QUALITY: Adequate    Sample Source URINE    STATUS: FINAL    ISOLATE 1:      Growth of mixed flora was isolated, suggesting probable contamination. No further testing will be performed. If clinically indicated, recollection using a method to minimize contamination, with prompt transfer to Urine Culture Transport Tube, is  recommended.   Urine Microscopic Only  Result Value Ref Range   WBC, UA 0-2/hpf 0-2/hpf   RBC / HPF none seen 0-2/hpf   Mucus, UA Presence of (A) None   Squamous Epithelial / LPF Few(5-10/hpf) (A) Rare(0-4/hpf)   Bacteria, UA Rare(<10/hpf) (A) None   Amorphous Present (A) None;Present  CBC  Result Value Ref Range   WBC 4.8 4.0 - 10.5 K/uL   RBC 5.27 (H) 3.87 - 5.11 Mil/uL   Platelets 245.0 150.0 - 400.0 K/uL   Hemoglobin 11.1 (L) 12.0 - 15.0 g/dL   HCT 35.5 (L) 36.0 - 46.0 %   MCV 67.4 (L) 78.0 - 100.0 fl   MCHC 31.3 30.0 - 36.0 g/dL   RDW 17.8 (H) 11.5 - 15.5 %  Comprehensive metabolic panel  Result Value Ref Range   Sodium 138 135 - 145 mEq/L   Potassium 3.5 3.5 - 5.1 mEq/L   Chloride 105 96 - 112 mEq/L   CO2 25 19 - 32 mEq/L   Glucose, Bld 104 (H) 70 - 99 mg/dL   BUN 11 6 - 23 mg/dL    Creatinine, Ser 0.74 0.40 - 1.20 mg/dL   Total Bilirubin 0.4 0.2 - 1.2 mg/dL   Alkaline Phosphatase 49 39 - 117 U/L   AST 13 0 - 37 U/L   ALT 9 0 - 35 U/L   Total Protein 7.4 6.0 - 8.3 g/dL   Albumin 4.1 3.5 - 5.2 g/dL   Calcium 9.6 8.4 - 10.5 mg/dL   GFR 102.47 >60.00 mL/min  Hemoglobin A1c  Result Value Ref Range   Hgb A1c MFr Bld 6.9 (H) 4.6 - 6.5 %  Lipid panel  Result Value Ref Range   Cholesterol 159 0 - 200 mg/dL   Triglycerides 133.0 0.0 - 149.0 mg/dL  HDL 42.50 >39.00 mg/dL   VLDL 26.6 0.0 - 40.0 mg/dL   LDL Cholesterol 89 0 - 99 mg/dL   Total CHOL/HDL Ratio 4    NonHDL 116.06   D-Dimer, Quantitative  Result Value Ref Range   D-Dimer, Quant 0.43 <0.50 mcg/mL FEU  POCT Urinalysis Dipstick (Automated)  Result Value Ref Range   Color, UA yellow    Clarity, UA cloudy    Glucose, UA Negative Negative   Bilirubin, UA Negative    Ketones, UA Negatve    Spec Grav, UA 1.015 1.010 - 1.025   Blood, UA 3+    pH, UA 7.0 5.0 - 8.0   Protein, UA Negative Negative   Urobilinogen, UA 0.2 0.2 or 1.0 E.U./dL   Nitrite, UA Negative    Leukocytes, UA Negative Negative  POCT rapid strep A  Result Value Ref Range   Rapid Strep A Screen Negative Negative  POCT urine pregnancy  Result Value Ref Range   Preg Test, Ur Negative Negative  Troponin I (High Sensitivity)  Result Value Ref Range   High Sens Troponin I <2 2 - 17 ng/L     Signed Lamar Blinks, MD  10/14, received her D-dimer.  It is normal-message to patient

## 2019-06-17 NOTE — Patient Instructions (Addendum)
It was nice to see you again today  You got your flu shot today After your blood draw, please go to the ground floor and have your chest x-ray.  Then you may go home.  I suspect your chest pain is due to pain in the muscles and bones of your rib cage.  However, we will check a chest x-ray and also some additional labs to help make sure there is nothing else wrong.  If you feel like you are getting worse, or if any other concerns, please go to the ER right away  Otherwise I will be in touch with your labs ASAP We will treat you for ear infection with doxycycline, 100 mg twice a day for 10 days

## 2019-06-19 LAB — URINE CULTURE
MICRO NUMBER:: 979195
SPECIMEN QUALITY:: ADEQUATE

## 2019-06-19 LAB — D-DIMER, QUANTITATIVE (NOT AT ARMC): D-Dimer, Quant: 0.43 mcg/mL FEU (ref <0.50)

## 2019-07-04 NOTE — Progress Notes (Signed)
Cardiology Office Note   Date:  07/05/2019   ID:  [name], DOB Feb 01, 1976, MRN [id number]  PCP:  Darreld Mclean, MD  Cardiologist:   Dorris Carnes, MD   F/U for palpitations     History of Present Illness: [name] is a 43 y.o. female with a history of palpitations and murmur  Echo shoedl LVEF normal   NOrmal valve function  I last saw her in 2019    She complains of occasional left-sided chest pain.  Pain is pleuritic in nature.  Not associated with fever.  Nonproductive.  No wheezes.  No other chest pains.  The patient denies palpitations.  The patient does not exercise regularly.  Current Meds  Medication Sig  . amLODipine (NORVASC) 5 MG tablet TAKE 1 TABLET(5 MG) BY MOUTH DAILY  . carvedilol (COREG) 6.25 MG tablet TAKE 1 TABLET(6.25 MG) BY MOUTH TWICE DAILY  . diclofenac (VOLTAREN) 75 MG EC tablet Take 1 tablet (75 mg total) by mouth 2 (two) times daily.  Marland Kitchen doxycycline (VIBRA-TABS) 100 MG tablet Take 1 tablet (100 mg total) by mouth 2 (two) times daily.  . fluticasone (FLONASE) 50 MCG/ACT nasal spray Place 2 sprays into both nostrils daily.  . Multiple Vitamin (MULTIVITAMIN) tablet Take 1 tablet by mouth daily.     Allergies:   Amoxicillin   Past Medical History:  Diagnosis Date  . Beta thalassemia trait   . Essential hypertension, benign 06/12/2012   [facility] abstract note also started on two  new meds for this see med list.  . No pertinent past medical history     Past Surgical History:  Procedure Laterality Date  . CESAREAN SECTION    . CESAREAN SECTION  04/17/2012   Procedure: CESAREAN SECTION;  Surgeon: Shelly Bombard, MD;  Location: [facility];  Service: Gynecology;  Laterality: N/A;     Social History:  The patient  reports that she has never smoked. She has never used smokeless tobacco. She reports that she does not drink alcohol or use drugs.   Family History:  The patient's family history includes  Hypertension in her mother; Sickle cell anemia in her brother.    ROS:  Please see the history of present illness. All other systems are reviewed and  Negative to the above problem except as noted.    PHYSICAL EXAM: VS:  BP 124/82   Pulse 73   Ht 5' 5"  (1.651 m)   Wt 229 lb (103.9 kg)   SpO2 99%   BMI 38.11 kg/m   BP on my check 136/82 GEN: Morbidly obese 43 yo in no acute distress  HEENT: normal  Neck: JVP is not elevated  NO  carotid bruits, or masses Cardiac: RRR;  Gr I/VI systolic murmur , rubs, or gallops,no edema  Respiratory:  clear to auscultation bilaterally, normal work of breathing GI: soft, nontender, nondistended, + BS  No hepatomegaly  MS: no deformity Moving all extremities   Skin: warm and dry, no rash Neuro:  Strength and sensation are intact Psych: euthymic mood, full affect   EKG:  EKG is not oredered today     Lipid Panel    Component Value Date/Time   CHOL 159 06/17/2019 1355   CHOL 170 05/01/2017 1111   TRIG 133.0 06/17/2019 1355   HDL 42.50 06/17/2019 1355   HDL 59 05/01/2017 1111   CHOLHDL 4 06/17/2019 1355   VLDL 26.6 06/17/2019 1355   LDLCALC 89 06/17/2019 1355  [place] 99 05/01/2017 1111      Wt Readings from Last 3 Encounters:  07/05/19 229 lb (103.9 kg)  06/17/19 230 lb (104.3 kg)  06/12/18 219 lb (99.3 kg)      ASSESSMENT AND PLAN:  1  CP  Atypcial   probably musculoskeletal.  I do not think cardiac.    2Palpitations patient denies palpitations stay hydrated.  No change in regimen.  2  HTN blood pressure is good on current regimen continue to follow on current regimen.  No changes for now.  3   Murmur  Echo with no valvular dz  Murmur normal flow.  4  HCM  Lipids awere excellent LDL 89.  HDL 43.  Triglycerides 133 2 weeks ago.  5  Obesity  Discussed wt loss I will refer to dietary.  In addition the patient is can being considered for surgical weight loss.  I think this would be great.  She needs to lose weight. We will  make follow-up as needed.  She is doing well.  I do not have any recommendations to add at this time.  Current medicines are reviewed at length with the patient today.  The patient does not have concerns regarding medicines.  Signed, Dorris Carnes, MD  07/05/2019 3:37 PM    [address], [place], [place]  [address] Phone: [phone]; Fax: [phone]

## 2019-07-05 ENCOUNTER — Ambulatory Visit: Payer: PRIVATE HEALTH INSURANCE | Admitting: Internal Medicine

## 2019-07-05 ENCOUNTER — Encounter: Payer: Self-pay | Admitting: Internal Medicine

## 2019-07-05 ENCOUNTER — Other Ambulatory Visit: Payer: Self-pay

## 2019-07-05 VITALS — BP 124/82 | HR 73 | Ht 65.0 in | Wt 229.0 lb

## 2019-07-05 DIAGNOSIS — I10 Essential (primary) hypertension: Secondary | ICD-10-CM

## 2019-07-05 DIAGNOSIS — R002 Palpitations: Secondary | ICD-10-CM

## 2019-07-05 DIAGNOSIS — R079 Chest pain, unspecified: Secondary | ICD-10-CM | POA: Diagnosis not present

## 2019-07-05 NOTE — Patient Instructions (Signed)
Medication Instructions:  No changes *If you need a refill on your cardiac medications before your next appointment, please call your pharmacy*  Lab Work: none If you have labs (blood work) drawn today and your tests are completely normal, you will receive your results only by: Marland Kitchen MyChart Message (if you have MyChart) OR . A paper copy in the mail If you have any lab test that is abnormal or we need to change your treatment, we will call you to review the results.  Testing/Procedures: none  Follow-Up: As needed with Dr. Harrington Challenger Other Instructions

## 2019-11-19 ENCOUNTER — Encounter: Payer: Self-pay | Admitting: Family Medicine

## 2019-11-19 ENCOUNTER — Ambulatory Visit: Payer: PRIVATE HEALTH INSURANCE | Admitting: Family Medicine

## 2019-11-19 VITALS — BP 130/90 | HR 60 | Ht 65.0 in | Wt 210.0 lb

## 2019-11-19 DIAGNOSIS — Z789 Other specified health status: Secondary | ICD-10-CM

## 2019-11-19 NOTE — Progress Notes (Signed)
Subjective:     Patient ID: [name], female   DOB: September 12, 1975, 44 y.o.   MRN: [id number]  HPI [name] for her required wellness visit for her insurance. Her PCP is Dr. Lamar Blinks, who she sees regularly for a physical. She reports she is due for a pap smear and will get this scheduled. She states she has never had a mammogram, denies any family history of breast cancer. She reports walking a lot at work, but otherwise no other exercise. She states she eats healthy, but only one meal a day with some snacks. She denies any current problems or concerns.  Past Medical History:  Diagnosis Date  . Beta thalassemia trait   . Essential hypertension, benign 06/12/2012   [facility] abstract note also started on two  new meds for this see med list.  . No pertinent past medical history    Allergies  Allergen Reactions  . Amoxicillin Rash    Current Outpatient Medications:  .  amLODipine (NORVASC) 5 MG tablet, TAKE 1 TABLET(5 MG) BY MOUTH DAILY, Disp: 90 tablet, Rfl: 3 .  carvedilol (COREG) 6.25 MG tablet, TAKE 1 TABLET(6.25 MG) BY MOUTH TWICE DAILY, Disp: 180 tablet, Rfl: 3 .  Multiple Vitamin (MULTIVITAMIN) tablet, Take 1 tablet by mouth daily., Disp: , Rfl:    Review of Systems  Constitutional: Negative for chills, fatigue, fever and unexpected weight change.  HENT: Negative for congestion, ear pain, sinus pressure, sinus pain and sore throat.   Eyes: Negative for discharge and visual disturbance.  Respiratory: Negative for cough, shortness of breath and wheezing.   Cardiovascular: Negative for chest pain and leg swelling.  Gastrointestinal: Negative for abdominal pain, blood in stool, constipation, diarrhea, nausea and vomiting.  Genitourinary: Negative for difficulty urinating and hematuria.  Skin: Negative for color change.  Neurological: Negative for dizziness, weakness, light-headedness and headaches.   Hematological: Negative for adenopathy.  All other systems reviewed and are negative.      Objective:   Physical Exam Vitals reviewed.  Constitutional:      General: She is not in acute distress.    Appearance: Normal appearance. She is well-developed.  HENT:     Head: Normocephalic and atraumatic.  Eyes:     General:        Right eye: No discharge.        Left eye: No discharge.  Cardiovascular:     Rate and Rhythm: Normal rate and regular rhythm.     Heart sounds: Normal heart sounds.  Pulmonary:     Effort: Pulmonary effort is normal. No respiratory distress.     Breath sounds: Normal breath sounds.  Musculoskeletal:     Cervical back: Neck supple.  Skin:    General: Skin is warm and dry.  Neurological:     Mental Status: She is alert and oriented to person, place, and time.  Psychiatric:        Mood and Affect: Mood normal.        Behavior: Behavior normal.    Today's Vitals   11/19/19 0838  BP: 130/90  Pulse: 60  SpO2: 99%  Weight: 210 lb (95.3 kg)  Height: 5' 5"  (1.651 m)   Body mass index is 34.95 kg/m.      Assessment:     Participant in health and wellness plan      Plan:     1. Keep all regularly scheduled appts with PCP. 2. Encouraged  healthy diet and exercise for weight loss.  3. Scheduled appt for mobile mammogram unit coming to [facility] on 3/25.  4. F/u here prn

## 2019-11-23 ENCOUNTER — Other Ambulatory Visit: Payer: Self-pay | Admitting: Internal Medicine

## 2020-01-06 ENCOUNTER — Encounter: Payer: Self-pay | Admitting: Family Medicine

## 2020-06-09 ENCOUNTER — Other Ambulatory Visit: Payer: Self-pay | Admitting: Internal Medicine

## 2020-08-03 ENCOUNTER — Telehealth: Payer: Self-pay | Admitting: Internal Medicine

## 2020-08-03 MED ORDER — AMLODIPINE BESYLATE 5 MG PO TABS: 5.0000 mg | ORAL_TABLET | Freq: Every day | ORAL | 0 refills | Status: DC

## 2020-08-03 NOTE — Telephone Encounter (Signed)
New Message   *STAT* If patient is at the pharmacy, call can be transferred to refill team.   1. Which medications need to be refilled? (please list name of each medication and dose if known) amLODipine (NORVASC) 5 MG tablet  2. Which pharmacy/location (including street and city if local pharmacy) is medication to be sent to? [facility], [place] - [address] AT [place]  3. Do they need a 30 day or 90 day supply? [place]

## 2020-08-03 NOTE — Telephone Encounter (Signed)
Pt's medication was sent to pt's pharmacy as requested. Confirmation received.  °

## 2020-08-07 ENCOUNTER — Encounter: Payer: Self-pay | Admitting: Internal Medicine

## 2020-08-07 ENCOUNTER — Other Ambulatory Visit: Payer: Self-pay

## 2020-08-07 ENCOUNTER — Ambulatory Visit (INDEPENDENT_AMBULATORY_CARE_PROVIDER_SITE_OTHER): Payer: PRIVATE HEALTH INSURANCE | Admitting: Internal Medicine

## 2020-08-07 VITALS — BP 136/76 | HR 71 | Ht 65.0 in | Wt 227.6 lb

## 2020-08-07 DIAGNOSIS — I10 Essential (primary) hypertension: Secondary | ICD-10-CM | POA: Diagnosis not present

## 2020-08-07 DIAGNOSIS — Z79899 Other long term (current) drug therapy: Secondary | ICD-10-CM | POA: Diagnosis not present

## 2020-08-07 DIAGNOSIS — R002 Palpitations: Secondary | ICD-10-CM | POA: Diagnosis not present

## 2020-08-07 MED ORDER — TRIAMTERENE-HCTZ 37.5-25 MG PO TABS: 0.5000 | ORAL_TABLET | Freq: Every day | ORAL | 3 refills | Status: DC

## 2020-08-07 NOTE — Patient Instructions (Addendum)
Medication Instructions:  Please start Maxzide 37.5/25 mg 1/2 tablet daily. Continue all other medications as listed.  *If you need a refill on your cardiac medications before your next appointment, please call your pharmacy*  You have been referred to the Hypertension Clinc and need to be seen in 1 month.  Lab Work: Please have blood work in 2 weeks (Lipid, BMP, Vit D) If you have labs (blood work) drawn today and your tests are completely normal, you will receive your results only by: Marland Kitchen MyChart Message (if you have MyChart) OR . A paper copy in the mail If you have any lab test that is abnormal or we need to change your treatment, we will call you to review the results.  Follow-Up: At [facility], you and your health needs are our priority.  As part of our continuing mission to provide you with exceptional heart care, we have created designated Provider Care Teams.  These Care Teams include your primary Cardiologist (physician) and Advanced Practice Providers (APPs -  Physician Assistants and Nurse Practitioners) who all work together to provide you with the care you need, when you need it.  We recommend signing up for the patient portal called "MyChart".  Sign up information is provided on this After Visit Summary.  MyChart is used to connect with patients for Virtual Visits (Telemedicine).  Patients are able to view lab/test results, encounter notes, upcoming appointments, etc.  Non-urgent messages can be sent to your provider as well.   To learn more about what you can do with MyChart, go to [email or web address].    Your next appointment:   6 month(s)  The format for your next appointment:   In Person  Provider:   Dorris Carnes, MD   Thank you for choosing [facility]!!

## 2020-08-07 NOTE — Progress Notes (Signed)
Cardiology Office Note   Date:  08/07/2020   ID:  [name], DOB 1976-07-27, MRN [id number]  PCP:  Darreld Mclean, MD  Cardiologist:   Dorris Carnes, MD   F/U for palpitations     History of Present Illness: [name] is a 44 y.o. female with a history of palpitations and murmur  Echo showed LVEF normal   NOrmal valve function  SHe had a hx of CP  Pain atypical  Pleuritic  The pt notes some chest heaviness  Last episode occurred and lasted 4 to 5 days   Started Saturday after doing chores  Heaviness  Denies wheezing.   Discomfort was somewhat pleuritic   Not positional, not worse with other activities  She denies palpitations  The pt remains very active   Works 3rd shift so she can be at home with kids.     Current Meds  Medication Sig  . amLODipine (NORVASC) 5 MG tablet Take 1 tablet (5 mg total) by mouth daily. Please keep upcoming appt in December with Dr. Harrington Challenger for future refills. Thank you  . carvedilol (COREG) 6.25 MG tablet TAKE 1 TABLET(6.25 MG) BY MOUTH TWICE DAILY  . Multiple Vitamin (MULTIVITAMIN) tablet Take 1 tablet by mouth daily.     Allergies:   Amoxicillin   Past Medical History:  Diagnosis Date  . Beta thalassemia trait   . Essential hypertension, benign 06/12/2012   [facility] abstract note also started on two  new meds for this see med list.  . No pertinent past medical history     Past Surgical History:  Procedure Laterality Date  . CESAREAN SECTION    . CESAREAN SECTION  04/17/2012   Procedure: CESAREAN SECTION;  Surgeon: Shelly Bombard, MD;  Location: [facility];  Service: Gynecology;  Laterality: N/A;     Social History:  The patient  reports that she has never smoked. She has never used smokeless tobacco. She reports that she does not drink alcohol and does not use drugs.   Family History:  The patient's family history includes Hypertension in her mother; Sickle cell anemia in her brother.    ROS:   Please see the history of present illness. All other systems are reviewed and  Negative to the above problem except as noted.    PHYSICAL EXAM: VS:  BP 136/76   Pulse 71   Ht 5' 5"  (1.651 m)   Wt 227 lb 9.6 oz (103.2 kg)   SpO2 97%   BMI 37.87 kg/m    GEN: Morbidly obese 44 yo in no acute distress  HEENT: normal  Neck: JVP is not elevated  NO  carotid bruits Cardiac: RRR;  Gr I/VI systolic murmur.  No LE edema  Respiratory:  clear to auscultation bilaterally,  Chest:  Nontender GI: soft, nontender, nondistended, + BS  No hepatomegaly  MS: no deformity Moving all extremities   Skin: warm and dry, no rash Neuro:  Strength and sensation are intact Psych: euthymic mood, full affect   EKG:  EKG is ordered today    SR 71 bpm     Lipid Panel    Component Value Date/Time   CHOL 159 06/17/2019 1355   CHOL 170 05/01/2017 1111   TRIG 133.0 06/17/2019 1355   HDL 42.50 06/17/2019 1355   HDL 59 05/01/2017 1111   CHOLHDL 4 06/17/2019 1355   VLDL 26.6 06/17/2019 1355   LDLCALC 89 06/17/2019 1355   LDLCALC 99 05/01/2017  1111      Wt Readings from Last 3 Encounters:  08/07/20 227 lb 9.6 oz (103.2 kg)  11/19/19 210 lb (95.3 kg)  07/05/19 229 lb (103.9 kg)      ASSESSMENT AND PLAN:  1  CP  Atypcial   May be more pleuritic   Folow.  I do not think cardiac.    2Palpitations   She denies   3  HTN blood pressureis up a little   Higher at other times I would recomm adding 1/2 Maxxzide daily.  Follow BMET     4  HCM  Last lipids were 2020  Would repeat    5  Obesity  Discussed diet   Recomm trial of Time Restricted Eating   Watch sugars    Return in 2 wks for Lipids, BMET and Vit D Follow BP   WIll refer to HTN clinic in 1 month I will plan to see in 6 months  Current medicines are reviewed at length with the patient today.  The patient does not have concerns regarding medicines.  Signed, Dorris Carnes, MD  08/07/2020 12:05 PM    [name], [place], [place]  [address] Phone: [phone]; Fax: [phone]

## 2020-08-21 ENCOUNTER — Other Ambulatory Visit: Payer: PRIVATE HEALTH INSURANCE | Admitting: *Deleted

## 2020-08-21 ENCOUNTER — Other Ambulatory Visit: Payer: Self-pay

## 2020-08-21 DIAGNOSIS — Z79899 Other long term (current) drug therapy: Secondary | ICD-10-CM

## 2020-08-21 DIAGNOSIS — I10 Essential (primary) hypertension: Secondary | ICD-10-CM

## 2020-08-21 DIAGNOSIS — R002 Palpitations: Secondary | ICD-10-CM

## 2020-08-22 LAB — BASIC METABOLIC PANEL
BUN/Creatinine Ratio: 11 (ref 9–23)
BUN: 8 mg/dL (ref 6–24)
CO2: 24 mmol/L (ref 20–29)
Calcium: 9.4 mg/dL (ref 8.7–10.2)
Chloride: 100 mmol/L (ref 96–106)
Creatinine, Ser: 0.74 mg/dL (ref 0.57–1.00)
GFR calc Af Amer: 112 mL/min/{1.73_m2} (ref >59)
GFR calc non Af Amer: 97 mL/min/{1.73_m2} (ref >59)
Glucose: 99 mg/dL (ref 65–99)
Potassium: 3.6 mmol/L (ref 3.5–5.2)
Sodium: 135 mmol/L (ref 134–144)

## 2020-08-22 LAB — LIPID PANEL
Chol/HDL Ratio: 3.1 ratio (ref 0.0–4.4)
Cholesterol, Total: 182 mg/dL (ref 100–199)
HDL: 59 mg/dL (ref >39)
LDL Chol Calc (NIH): 109 mg/dL — ABNORMAL HIGH (ref 0–99)
Triglycerides: 76 mg/dL (ref 0–149)
VLDL Cholesterol Cal: 14 mg/dL (ref 5–40)

## 2020-08-22 LAB — VITAMIN D 25 HYDROXY (VIT D DEFICIENCY, FRACTURES): Vit D, 25-Hydroxy: 32.9 ng/mL (ref 30.0–100.0)

## 2020-08-26 ENCOUNTER — Telehealth (INDEPENDENT_AMBULATORY_CARE_PROVIDER_SITE_OTHER): Payer: PRIVATE HEALTH INSURANCE | Admitting: Medical

## 2020-08-26 ENCOUNTER — Other Ambulatory Visit: Payer: Self-pay

## 2020-08-26 ENCOUNTER — Other Ambulatory Visit: Payer: Self-pay | Admitting: Medical

## 2020-08-26 ENCOUNTER — Other Ambulatory Visit: Payer: Self-pay | Admitting: *Deleted

## 2020-08-26 VITALS — BP 154/80 | HR 100 | Temp 98.9°F

## 2020-08-26 DIAGNOSIS — J019 Acute sinusitis, unspecified: Secondary | ICD-10-CM

## 2020-08-26 DIAGNOSIS — R059 Cough, unspecified: Secondary | ICD-10-CM | POA: Diagnosis not present

## 2020-08-26 DIAGNOSIS — J029 Acute pharyngitis, unspecified: Secondary | ICD-10-CM | POA: Diagnosis not present

## 2020-08-26 MED ORDER — BENZONATATE 100 MG PO CAPS: 100.0000 mg | ORAL_CAPSULE | Freq: Three times a day (TID) | ORAL | 0 refills | Status: DC | PRN

## 2020-08-26 MED ORDER — AZITHROMYCIN 250 MG PO TABS
ORAL_TABLET | ORAL | 0 refills | Status: DC
Start: 2020-08-26 — End: 2021-12-08

## 2020-08-26 MED ORDER — CHOLECALCIFEROL 100 MCG (4000 UT) PO TABS: 4000.0000 [IU] | ORAL_TABLET | Freq: Every day | ORAL | Status: AC

## 2020-08-26 MED ORDER — FLUTICASONE PROPIONATE 50 MCG/ACT NA SUSP
2.0000 | Freq: Every day | NASAL | 1 refills | Status: DC
Start: 2020-08-26 — End: 2020-08-26

## 2020-08-26 NOTE — Patient Instructions (Signed)
Recent onset of nasal congestion, cough, sore throat and now prominent maxillary/frontal sinus pressure.  Appears to have sinus infection.  We will prescribe azithromycin antibiotic, benzonatate cough tablets and Flonase nasal spray.  Rapid Covid test was negative but in light of recent increase of infections as well as patient notification did advise patient to get PCR test.  Writing work excuse note and sending it to her MyChart.  Follow-up in 7 to 10 days or as needed.

## 2020-08-26 NOTE — Progress Notes (Signed)
° °  Subjective:    Patient ID: [name], female    DOB: [birth year], 44 y.o.   MRN: [id number]  HPI  Virtual Visit via Video Note  I connected with [name] on 08/26/20 at  9:00 AM EST by a video enabled telemedicine application and verified that I am speaking with the correct person using two identifiers.  Location: Patient: home Provider: home  Particpiants- pt and myself.   I discussed the limitations of evaluation and management by telemedicine and the availability of in person appointments. The patient expressed understanding and agreed to proceed.  History of Present Illness: Pt has cough, nasal congestion and st since Monday.  She states yesterday cough became productive. Pt does have some frontal and maxillary sinus pressure with ha.  Pt states felt fever on Monday but no did not have fever. Some muscles.  Pt works as a Marine scientist. She had rapid test.  Pt called out from work.   Observations/Objective: General-no acute distress, pleasant, oriented. Lungs- on inspection lungs appear unlabored. Neck- no tracheal deviation or jvd on inspection. Neuro- gross motor function appears intact. heent- frontal and maxillary sinus pressure.    Assessment and Plan: Recent onset of nasal congestion, cough, sore throat and now prominent maxillary/frontal sinus pressure.  Appears to have sinus infection.  We will prescribe azithromycin antibiotic, benzonatate cough tablets and Flonase nasal spray.  Rapid Covid test was negative but in light of recent increase of infections as well as patient notification did advise patient to get PCR test.  Writing work excuse note and sending it to her MyChart.  Follow-up in 7 to 10 days or as needed.  [name], PA-C  Follow Up Instructions:    I discussed the assessment and treatment plan with the patient. The patient was provided an opportunity to ask questions and all were answered. The patient agreed with the plan  and demonstrated an understanding of the instructions.   The patient was advised to call back or seek an in-person evaluation if the symptoms worsen or if the condition fails to improve as anticipated.  Time spent with patient today was 20  minutes which consisted of chart revdiew, discussing diagnosis, work up treatment and documentation.   [name], PA-C   Review of Systems  Constitutional: Negative for chills and fever.  HENT: Positive for congestion, sinus pressure, sinus pain and sore throat. Negative for trouble swallowing.   Respiratory: Positive for cough. Negative for shortness of breath and wheezing.   Cardiovascular: Negative for chest pain.  Gastrointestinal: Negative for abdominal pain.  Neurological: Positive for headaches. Negative for dizziness, seizures, weakness and numbness.       Mild ha with sinus pressure.  Hematological: Negative for adenopathy. Does not bruise/bleed easily.  Psychiatric/Behavioral: Negative for confusion.       Objective:   Physical Exam        Assessment & Plan:

## 2020-09-02 ENCOUNTER — Other Ambulatory Visit: Payer: Self-pay | Admitting: Internal Medicine

## 2020-09-10 ENCOUNTER — Other Ambulatory Visit: Payer: Self-pay

## 2020-09-10 ENCOUNTER — Ambulatory Visit (INDEPENDENT_AMBULATORY_CARE_PROVIDER_SITE_OTHER): Payer: PRIVATE HEALTH INSURANCE | Admitting: Pharmacist

## 2020-09-10 ENCOUNTER — Encounter: Payer: Self-pay | Admitting: Pharmacist

## 2020-09-10 VITALS — BP 136/88 | HR 72

## 2020-09-10 DIAGNOSIS — I10 Essential (primary) hypertension: Secondary | ICD-10-CM

## 2020-09-10 MED ORDER — TRIAMTERENE-HCTZ 37.5-25 MG PO TABS: 1.0000 | ORAL_TABLET | Freq: Every day | ORAL | 1 refills | Status: DC

## 2020-09-10 NOTE — Patient Instructions (Addendum)
It was nice meeting you today!  We would like to keep your blood pressure less than 130/80  Start taking a whole tablet of your triamterene/HCTZ 37.5/25 once a day when you wake up  Continue your amlodipine 5 mg and your carvedilol 6.25 mg twice a day  We will recheck your lab work in 2 weeks  Try to watch the amount of salt you are having in your diet  Please call with any questions!  Karren Cobble, PharmD, BCACP, [place], [place] [address], [place], [place] [address] Phone: [phone]; Fax: [phone] 09/10/2020 10:25 AM

## 2020-09-10 NOTE — Progress Notes (Signed)
Patient ID: [name]                 DOB: 1976-03-24                      MRN: [id number]     HPI: [name] is a 45 y.o. female referred by Dr. Harrington Challenger to HTN clinic. PMH is significant for HTN, palpitations, and murmur.  Was seen by 12/3 for chest pain and BP was elevated at 136/76.  Maxzide 37.5-25 (1/2 tablet) was initiated.  Patient presents today in good spirits.  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  Typically sleeps from 11AM to to 1:30pm then has to pick up son from school.  Goes back to sleep around 6:30pm to 10:00pm and is at work at 11:00.  Used to drink multiple cups of coffee overnight but reports has cut down significantly.  Does not watch salt in her diet. She does not think she has sleep apnea but husband reports she does occasionally snore.  Does not follow a formal exercise plan but is very active at work and on her feet.  Reports occasional swelling in feet which resolves when she sits or lays down.  Still has occasional chest pressure.    Current HTN meds: amlodipine 61m daily, carvedilol 6.25 mg BID, triamterere-HCTZ 37.545m25mg (1/2 tablet daily) Previously tried: amlodipine 10, carvedilol 12.5 BID, metoprolol 2551maily,  BP goal: <130/80  Family History: mother had high BP  Social History: no tobacco use.  Drinks coffee during the night  Exercise: walks at work  Home BP readings: Takes rarely but systolic BP typically in 130458K/998Pt Readings from Last 3 Encounters:  08/07/20 227 lb 9.6 oz (103.2 kg)  11/19/19 210 lb (95.3 kg)  07/05/19 229 lb (103.9 kg)   BP Readings from Last 3 Encounters:  08/26/20 (!) 154/80  08/07/20 136/76  11/19/19 130/90   Pulse Readings from Last 3 Encounters:  08/26/20 100  08/07/20 71  11/19/19 60    Renal function: CrCl cannot be calculated (Unknown ideal weight.).  Past Medical History:  Diagnosis Date  . Beta thalassemia trait   . Essential hypertension, benign 06/12/2012   [facility] also started on two  new meds for this see med list.  . No pertinent past medical history     Current Outpatient Medications on File Prior to Visit  Medication Sig Dispense Refill  . amLODipine (NORVASC) 5 MG tablet Take 1 tablet (5 mg total) by mouth daily. Please keep upcoming appt in December with Dr. RosHarrington Challengerr future refills. Thank you 90 tablet 0  . azithromycin (ZITHROMAX) 250 MG tablet Take 2 tablets by mouth on day 1, followed by 1 tablet by mouth daily for 4 days. 6 tablet 0  . benzonatate (TESSALON) 100 MG capsule Take 1 capsule (100 mg total) by mouth 3 (three) times daily as needed for cough. 30 capsule 0  . carvedilol (COREG) 6.25 MG tablet TAKE 1 TABLET(6.25 MG) BY MOUTH TWICE DAILY 180 tablet 3  . Cholecalciferol 100 MCG (4000 UT) TABS Take 4,000 Units by mouth daily.    . fluticasone (FLONASE) 50 MCG/ACT nasal spray SHAKE LIQUID AND USE 2 SPRAYS IN EACH NOSTRIL DAILY 48 g 2  . Multiple Vitamin (MULTIVITAMIN) tablet Take 1 tablet by mouth daily.    . tMarland Kitcheniamterene-hydrochlorothiazide (MAXZIDE-25) 37.5-25 MG tablet Take 0.5 tablets by mouth daily. 45 tablet 3  No current facility-administered medications on file prior to visit.    Allergies  Allergen Reactions  . Amoxicillin Rash     Assessment/Plan:  1. Hypertension - Patient BP today 136/88 which is above goal of <130/80.  Likely negative impacted by poor sleep habits and high salt diet.  While patient does not want to increase pharmacologic therapy, recommend increasing triamterene/HCTZ to 1 full tablet (37.5-25mg daily) and rechecking lab work in [facility].  Recommended patient begin to watch salt in her diet since it is also likely contributory.  Will recheck in clinic in 4 weeks.  Start Maxzide 37.5-25 mg daily Continue amlodipine 14m daily Continue carvedilol 6.246mBID Recheck lab work in 2 weeks  ChKarren CobblePharmD, BCSutterCDLouisvilleCPC[place]GrWatrous[place] [id number]: [phone]: [phone]/02/2021 5:13 PM

## 2020-09-29 ENCOUNTER — Other Ambulatory Visit: Payer: PRIVATE HEALTH INSURANCE

## 2020-10-27 ENCOUNTER — Other Ambulatory Visit: Payer: Self-pay | Admitting: Internal Medicine

## 2021-01-25 ENCOUNTER — Other Ambulatory Visit: Payer: Self-pay | Admitting: Internal Medicine

## 2021-08-23 ENCOUNTER — Other Ambulatory Visit: Payer: Self-pay

## 2021-08-23 MED ORDER — AMLODIPINE BESYLATE 5 MG PO TABS: 5.0000 mg | ORAL_TABLET | Freq: Every day | ORAL | 0 refills | Status: DC

## 2021-09-22 ENCOUNTER — Other Ambulatory Visit: Payer: Self-pay | Admitting: Internal Medicine

## 2021-09-27 ENCOUNTER — Other Ambulatory Visit: Payer: Self-pay | Admitting: *Deleted

## 2021-09-27 DIAGNOSIS — I10 Essential (primary) hypertension: Secondary | ICD-10-CM

## 2021-09-27 MED ORDER — TRIAMTERENE-HCTZ 37.5-25 MG PO TABS: 1.0000 | ORAL_TABLET | Freq: Every day | ORAL | 0 refills | Status: DC

## 2021-12-08 ENCOUNTER — Encounter: Payer: Self-pay | Admitting: Family Medicine

## 2021-12-08 ENCOUNTER — Ambulatory Visit (INDEPENDENT_AMBULATORY_CARE_PROVIDER_SITE_OTHER): Payer: PRIVATE HEALTH INSURANCE | Admitting: Family Medicine

## 2021-12-08 ENCOUNTER — Ambulatory Visit (HOSPITAL_BASED_OUTPATIENT_CLINIC_OR_DEPARTMENT_OTHER)
Admission: RE | Admit: 2021-12-08 | Discharge: 2021-12-08 | Disposition: A | Discharge status: Home / Self Care | Payer: PRIVATE HEALTH INSURANCE | Source: Ambulatory Visit | Attending: Family Medicine | Admitting: Family Medicine

## 2021-12-08 ENCOUNTER — Other Ambulatory Visit: Payer: Self-pay | Admitting: Family Medicine

## 2021-12-08 VITALS — BP 138/85 | HR 84 | Ht 65.0 in | Wt 232.0 lb

## 2021-12-08 DIAGNOSIS — M25562 Pain in left knee: Secondary | ICD-10-CM

## 2021-12-08 MED ORDER — MELOXICAM 15 MG PO TABS: 15.0000 mg | ORAL_TABLET | Freq: Every day | ORAL | 0 refills | Status: DC

## 2021-12-08 NOTE — Patient Instructions (Addendum)
Starting with xrays and meloxicam (antiinflammatory; do not take with any other NSAIDs like ibuprofen/Aleve.) ?Sending a sports medicine referral because you may need an MRI given the severity of your pain.  ?Rest, Ice, Compression, Elevation and pain management for now.  ?

## 2021-12-08 NOTE — Progress Notes (Signed)
? ?Acute Office Visit ? ?Subjective:  ? ? Patient ID: [name], female    DOB: November 16, 1975, 46 y.o.   MRN: [id number] ? ?Chief Complaint  ?Patient presents with  ? Knee Pain  ?  Left knee x4-5 days  ? ? ?Knee Pain  ? ?Patient is in today for left knee pain ? ? ?KNEE PAIN ?Onset: 4-5 days ago, no injury, just walking at work and it suddenly started hurting, unsure if she had twisted/turned it; does not recall any popping/clicking at the time, but states she has heard occasional popping in the past and never thought much of it ?Location: left knee, pain mostly lateral and posterior/deep ?Duration: constant ?Characteristics: stabbing pain, trouble with ROM,  ?Aggravating factors: weight bearing, flexion, full extension  ?Alleviating factors: mild/temporary improvement with IBU  ?Radiating/associated symptoms: pain radiates into lower leg, no numbness/tingling  ?Timing: constant  ?Severity: 6/10; ibuprofen helps a little bit; pain keeps her from sleeping  ? ? ? ? ? ?Past Medical History:  ?Diagnosis Date  ? Beta thalassemia trait   ? Essential hypertension, benign 06/12/2012  ? [facility] note also started on two  new meds for this see med list.  ? No pertinent past medical history   ? ? ?Past Surgical History:  ?Procedure Laterality Date  ? CESAREAN SECTION    ? CESAREAN SECTION  04/17/2012  ? Procedure: CESAREAN SECTION;  Surgeon: Shelly Bombard, MD;  Location: [facility];  Service: Gynecology;  Laterality: N/A;  ? ? ?Family History  ?Problem Relation Age of Onset  ? Sickle cell anemia Brother   ? Hypertension Mother   ? ? ?Social History  ? ?Socioeconomic History  ? Marital status: Married  ?  Spouse name: Not on file  ? Number of children: Not on file  ? Years of education: Not on file  ? Highest education level: Not on file  ?Occupational History  ? Not on file  ?Tobacco Use  ? Smoking status: Never  ? Smokeless tobacco: Never  ?Vaping Use  ? Vaping Use: Never used  ?Substance and  Sexual Activity  ? Alcohol use: No  ? Drug use: No  ? Sexual activity: Yes  ?Other Topics Concern  ? Not on file  ?Social History Narrative  ? Not on file  ? ?Social Determinants of Health  ? ?Financial Resource Strain: Not on file  ?Food Insecurity: Not on file  ?Transportation Needs: Not on file  ?Physical Activity: Not on file  ?Stress: Not on file  ?Social Connections: Not on file  ?Intimate Partner Violence: Not on file  ? ? ?Outpatient Medications Prior to Visit  ?Medication Sig Dispense Refill  ? amLODipine (NORVASC) 5 MG tablet Take 1 tablet (5 mg total) by mouth daily. Please make overdue appt with Dr. Harrington Challenger before anymore refills. Thank you 2nd attempt 15 tablet 0  ? carvedilol (COREG) 6.25 MG tablet Take 1 tablet (6.25 mg total) by mouth 2 (two) times daily with a meal. Please make overdue appt with Dr. Harrington Challenger before anymore refills. Thank you 2nd attempt 30 tablet 0  ? Cholecalciferol 100 MCG (4000 UT) TABS Take 4,000 Units by mouth daily.    ? fluticasone (FLONASE) 50 MCG/ACT nasal spray SHAKE LIQUID AND USE 2 SPRAYS IN EACH NOSTRIL DAILY 48 g 2  ? Multiple Vitamin (MULTIVITAMIN) tablet Take 1 tablet by mouth daily.    ? triamterene-hydrochlorothiazide (MAXZIDE-25) 37.5-25 MG tablet Take 1 tablet by mouth daily. 30 tablet 0  ?  azithromycin (ZITHROMAX) 250 MG tablet Take 2 tablets by mouth on day 1, followed by 1 tablet by mouth daily for 4 days. 6 tablet 0  ? benzonatate (TESSALON) 100 MG capsule Take 1 capsule (100 mg total) by mouth 3 (three) times daily as needed for cough. 30 capsule 0  ? ?No facility-administered medications prior to visit.  ? ? ?Allergies  ?Allergen Reactions  ? Amoxicillin Rash  ? ? ?Review of Systems ?All review of systems negative except what is listed in the HPI ? ?   ?Objective:  ?  ?Physical Exam ?Vitals reviewed.  ?Constitutional:   ?   Appearance: Normal appearance. She is obese.  ?Musculoskeletal:  ?   Right lower leg: No edema.  ?   Left lower leg: No edema.  ?   Comments:  Minimal edema around left knee; Pain with terminal flexion/ and pain/popping with extension, pain with varus stress  ?Skin: ?   General: Skin is warm and dry.  ?   Findings: No bruising.  ?Neurological:  ?   General: No focal deficit present.  ?   Mental Status: She is alert and oriented to person, place, and time. Mental status is at baseline.  ?Psychiatric:     ?   Mood and Affect: Mood normal.     ?   Behavior: Behavior normal.     ?   Thought Content: Thought content normal.     ?   Judgment: Judgment normal.  ? ? ?BP (!) 158/87   Pulse 84   Ht 5' 5"  (1.651 m)   Wt 232 lb (105.2 kg)   BMI 38.61 kg/m?  ?Wt Readings from Last 3 Encounters:  ?12/08/21 232 lb (105.2 kg)  ?08/07/20 227 lb 9.6 oz (103.2 kg)  ?11/19/19 210 lb (95.3 kg)  ? ? ?Health Maintenance Due  ?Topic Date Due  ? COVID-19 Vaccine (1) Never done  ? Hepatitis C Screening  Never done  ? PAP SMEAR-Modifier  09/05/2014  ? COLONOSCOPY (Pts 45-58yr Insurance coverage will need to be confirmed)  Never done  ? ? ?There are no preventive care reminders to display for this patient. ? ? ?Lab Results  ?Component Value Date  ? TSH 0.43 06/24/2016  ? ?Lab Results  ?Component Value Date  ? WBC 4.8 06/17/2019  ? HGB 11.1 (L) 06/17/2019  ? HCT 35.5 (L) 06/17/2019  ? MCV 67.4 (L) 06/17/2019  ? PLT 245.0 06/17/2019  ? ?Lab Results  ?Component Value Date  ? NA 135 08/21/2020  ? K 3.6 08/21/2020  ? CO2 24 08/21/2020  ? GLUCOSE 99 08/21/2020  ? BUN 8 08/21/2020  ? CREATININE 0.74 08/21/2020  ? BILITOT 0.4 06/17/2019  ? ALKPHOS 49 06/17/2019  ? AST 13 06/17/2019  ? ALT 9 06/17/2019  ? PROT 7.4 06/17/2019  ? ALBUMIN 4.1 06/17/2019  ? CALCIUM 9.4 08/21/2020  ? GFR 102.47 06/17/2019  ? ?Lab Results  ?Component Value Date  ? CHOL 182 08/21/2020  ? ?Lab Results  ?Component Value Date  ? HDL 59 08/21/2020  ? ?Lab Results  ?Component Value Date  ? LDLCALC 109 (H) 08/21/2020  ? ?Lab Results  ?Component Value Date  ? TRIG 76 08/21/2020  ? ?Lab Results  ?Component Value Date   ? CHOLHDL 3.1 08/21/2020  ? ?Lab Results  ?Component Value Date  ? HGBA1C 6.9 (H) 06/17/2019  ? ? ?   ?Assessment & Plan:  ? ?1. Acute pain of left knee ?Pain with all special tests today,  trouble differentiating.  ?Starting with xrays and meloxicam (antiinflammatory; do not take with any other NSAIDs like ibuprofen/Aleve.) ?Sending a sports medicine referral because you may need an MRI given the severity of your pain.  ?Rest, Ice, Compression (brace/sleeve you've been wearing), Elevation and pain management for now.  ?- DG Knee Complete 4 Views Left; Future ?- DG Knee 1-2 Views Right; Future ?- meloxicam (MOBIC) 15 MG tablet; Take 1 tablet (15 mg total) by mouth daily.  Dispense: 30 tablet; Refill: 0 ?- Ambulatory referral to Sports Medicine ? ?Follow-up after seeing sports medicine or as needed.  ? ?Terrilyn Saver, NP ? ?

## 2021-12-30 NOTE — Patient Instructions (Addendum)
It was nice to see you again today, I will be in touch with your labs and pap asap ?Please set up your mammogram asap ? ?I refilled your BP meds for now- please schedule to see Dr Harrington Challenger soon to check on your heart ?You should get a cologuard kit at home soon  ? ? ?

## 2021-12-30 NOTE — Progress Notes (Addendum)
Therapist, music at [employer] ?[address], [address] ?[place], [place] [address] ?336 [phone] ?Fax [phone] ? ?Date:  01/05/2022  ? ?Name:  [name]   DOB:  Nov 13, 1975   MRN:  [id number] ? ?PCP:  Regina Mclean, MD  ? ? ?Chief Complaint: Annual Exam (Concerns/ questions: mobic helped the knee pain/Hep C screen due/Pap:none recent/Colonoscopy: none) ? ? ?History of Present Illness: ? ?[name] is a 46 y.o. very pleasant female patient who presents with the following: ? ?Patient seen today for follow-up-history of hypertension and beta thalassemia ?Most recent visit with myself was October 2020 ?She was seen recently by our nurse practitioner Ms. Beck for knee pain- mobic is helping her  ? ?Cardiology follow-up, seen in January of last year in hypertension clinic ?She saw Dr. Harrington Challenger in December 2021 ? ?Her kids are 21 and 74 yo- they are doing well  ?She works 3rd shift- difficult to exercise-  ? ?COVID-19 vaccination- UTD per pt  ?Hepatitis C screening- will do today  ?Colon cancer- no family history or sx, she prefers Cologuard  ?Pap screening- last one 9 years ago!   ?Mammogram- order for her, last done at [facility] but she would like to do here ?Due for lab work today- she worked last night.  She not eaten in a few hours ?She noted irregular menses for the last 3-4 months; her cycle is spacing out w perimenopause  ?Patient Active Problem List  ? Diagnosis Date Noted  ? Cough 09/14/2016  ? Heart murmur 09/02/2015  ? Benign essential HTN 03/23/2015  ? 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 Elderly multigravida with antepartum condition or complication 04/88/8916  ? Beta thalassemia trait 10/11/2011  ? ? ?Past Medical History:  ?Diagnosis Date  ? Beta thalassemia trait   ? Essential hypertension, benign 06/12/2012  ? [facility] abstract note also started on two  new meds for this see med list.  ? No pertinent past medical history   ? ? ?Past Surgical History:   ?Procedure Laterality Date  ? CESAREAN SECTION    ? CESAREAN SECTION  04/17/2012  ? Procedure: CESAREAN SECTION;  Surgeon: Shelly Bombard, MD;  Location: [facility];  Service: Gynecology;  Laterality: N/A;  ? ? ?Social History  ? ?Tobacco Use  ? Smoking status: Never  ? Smokeless tobacco: Never  ?Vaping Use  ? Vaping Use: Never used  ?Substance Use Topics  ? Alcohol use: No  ? Drug use: No  ? ? ?Family History  ?Problem Relation Age of Onset  ? Sickle cell anemia Brother   ? Hypertension Mother   ? ? ?Allergies  ?Allergen Reactions  ? Amoxicillin Rash  ? ? ?Medication list has been reviewed and updated. ? ?Current Outpatient Medications on File Prior to Visit  ?Medication Sig Dispense Refill  ? Cholecalciferol 100 MCG (4000 UT) TABS Take 4,000 Units by mouth daily.    ? fluticasone (FLONASE) 50 MCG/ACT nasal spray SHAKE LIQUID AND USE 2 SPRAYS IN EACH NOSTRIL DAILY 48 g 2  ? Multiple Vitamin (MULTIVITAMIN) tablet Take 1 tablet by mouth daily.    ? ?No current facility-administered medications on file prior to visit.  ? ? ?Review of Systems: ? ?As per HPI- otherwise negative. ? ? ?Physical Examination: ?Vitals:  ? 01/05/22 0856  ?BP: 122/86  ?Pulse: 64  ?Resp: 18  ?Temp: 98.4 ?F (36.9 ?C)  ?SpO2: 99%  ? ?Vitals:  ? 01/05/22 0856  ?  Weight: 231 lb 6.4 oz (105 kg)  ?Height: 5' 5"  (1.651 m)  ? ?Body mass index is 38.51 kg/m?. ?Ideal Body Weight: Weight in (lb) to have BMI = 25: 149.9 ? ?GEN: no acute distress.  Obese, looks well  ?HEENT: Atraumatic, Normocephalic.   Bilateral TM wnl, oropharynx normal.  PEERL,EOMI.   ?Ears and Nose: No external deformity. ?CV: RRR, No M/G/R. No JVD. No thrill. No extra heart sounds. ?PULM: CTA B, no wheezes, crackles, rhonchi. No retractions. No resp. distress. No accessory muscle use. ?ABD: S, NT, ND, +BS. No rebound. No HSM. ?EXTR: No c/c/e ?PSYCH: Normally interactive. Conversant.  ?Normal pelvic exam today ? ?Assessment and Plan: ?Physical exam ? ?Screening for deficiency anemia -  Plan: CBC ? ?Screening for diabetes mellitus - Plan: Comprehensive metabolic panel, Hemoglobin A1c ? ?Screening for thyroid disorder - Plan: TSH ? ?Encounter for hepatitis C screening test for low risk patient - Plan: Hepatitis C antibody ? ?Screening for hyperlipidemia - Plan: Lipid panel ? ?Beta thalassemia trait ? ?Benign essential HTN - Plan: amLODipine (NORVASC) 5 MG tablet, carvedilol (COREG) 6.25 MG tablet ? ?Screening for colon cancer - Plan: Cologuard ? ?Encounter for screening mammogram for malignant neoplasm of breast - Plan: MM 3D SCREEN BREAST BILATERAL ? ?Screening for cervical cancer - Plan: Cytology - PAP ? ?Irregular menstrual bleeding - Plan: POCT urine pregnancy ? ?Acute pain of left knee - Plan: meloxicam (MOBIC) 15 MG tablet ? ?Physical exam today ?Encourage healthy diet and exercise routine as best she can ?Pap done, mammogram ordered ?Cologuard ordered  ?BP under good control- refilled medication for her ?Encouraged her to see Dr Harrington Challenger soon for cardiology follow-up ? ?Signed ?Lamar Blinks, MD ? ?Received labs as below, message to patient ? ?Results for orders placed or performed in visit on 01/05/22  ?CBC  ?Result Value Ref Range  ? WBC 4.1 4.0 - 10.5 K/uL  ? RBC 5.29 (H) 3.87 - 5.11 Mil/uL  ? Platelets 218.0 150.0 - 400.0 K/uL  ? Hemoglobin 11.0 (L) 12.0 - 15.0 g/dL  ? HCT 35.4 (L) 36.0 - 46.0 %  ? MCV 67.0 (L) 78.0 - 100.0 fl  ? MCHC 31.1 30.0 - 36.0 g/dL  ? RDW 18.4 (H) 11.5 - 15.5 %  ?Comprehensive metabolic panel  ?Result Value Ref Range  ? Sodium 136 135 - 145 mEq/L  ? Potassium 3.6 3.5 - 5.1 mEq/L  ? Chloride 103 96 - 112 mEq/L  ? CO2 25 19 - 32 mEq/L  ? Glucose, Bld 103 (H) 70 - 99 mg/dL  ? BUN 11 6 - 23 mg/dL  ? Creatinine, Ser 0.74 0.40 - 1.20 mg/dL  ? Total Bilirubin 0.5 0.2 - 1.2 mg/dL  ? Alkaline Phosphatase 45 39 - 117 U/L  ? AST 14 0 - 37 U/L  ? ALT 11 0 - 35 U/L  ? Total Protein 7.3 6.0 - 8.3 g/dL  ? Albumin 4.3 3.5 - 5.2 g/dL  ? GFR 95.93 >60.00 mL/min  ? Calcium 9.1 8.4 -  10.5 mg/dL  ?Hemoglobin A1c  ?Result Value Ref Range  ? Hgb A1c MFr Bld 6.5 4.6 - 6.5 %  ?Lipid panel  ?Result Value Ref Range  ? Cholesterol 164 0 - 200 mg/dL  ? Triglycerides 56.0 0.0 - 149.0 mg/dL  ? HDL 51.00 >39.00 mg/dL  ? VLDL 11.2 0.0 - 40.0 mg/dL  ? LDL Cholesterol 102 (H) 0 - 99 mg/dL  ? Total CHOL/HDL Ratio 3   ? NonHDL 113.13   ?  TSH  ?Result Value Ref Range  ? TSH 0.27 (L) 0.35 - 5.50 uIU/mL  ?POCT urine pregnancy  ?Result Value Ref Range  ? Preg Test, Ur Negative Negative  ? ? ? ?

## 2022-01-05 ENCOUNTER — Other Ambulatory Visit: Payer: Self-pay | Admitting: Family Medicine

## 2022-01-05 ENCOUNTER — Encounter (HOSPITAL_BASED_OUTPATIENT_CLINIC_OR_DEPARTMENT_OTHER): Payer: Self-pay

## 2022-01-05 ENCOUNTER — Ambulatory Visit (INDEPENDENT_AMBULATORY_CARE_PROVIDER_SITE_OTHER): Payer: PRIVATE HEALTH INSURANCE | Admitting: Family Medicine

## 2022-01-05 ENCOUNTER — Encounter: Payer: Self-pay | Admitting: Family Medicine

## 2022-01-05 ENCOUNTER — Ambulatory Visit (HOSPITAL_BASED_OUTPATIENT_CLINIC_OR_DEPARTMENT_OTHER)
Admission: RE | Admit: 2022-01-05 | Discharge: 2022-01-05 | Disposition: A | Discharge status: Home / Self Care | Payer: PRIVATE HEALTH INSURANCE | Source: Ambulatory Visit | Attending: Family Medicine | Admitting: Family Medicine

## 2022-01-05 ENCOUNTER — Other Ambulatory Visit (HOSPITAL_COMMUNITY)
Admission: RE | Admit: 2022-01-05 | Discharge: 2022-01-05 | Disposition: A | Discharge status: Home / Self Care | Payer: PRIVATE HEALTH INSURANCE | Source: Ambulatory Visit | Attending: Family Medicine | Admitting: Family Medicine

## 2022-01-05 VITALS — BP 122/86 | HR 64 | Temp 98.4°F | Resp 18 | Ht 65.0 in | Wt 231.4 lb

## 2022-01-05 DIAGNOSIS — N926 Irregular menstruation, unspecified: Secondary | ICD-10-CM | POA: Diagnosis not present

## 2022-01-05 DIAGNOSIS — M25562 Pain in left knee: Secondary | ICD-10-CM

## 2022-01-05 DIAGNOSIS — Z1231 Encounter for screening mammogram for malignant neoplasm of breast: Secondary | ICD-10-CM | POA: Diagnosis present

## 2022-01-05 DIAGNOSIS — D563 Thalassemia minor: Secondary | ICD-10-CM

## 2022-01-05 DIAGNOSIS — Z124 Encounter for screening for malignant neoplasm of cervix: Secondary | ICD-10-CM

## 2022-01-05 DIAGNOSIS — Z130 Encounter for screening for diseases of the blood and blood-forming organs and certain disorders involving the immune mechanism: Secondary | ICD-10-CM | POA: Diagnosis not present

## 2022-01-05 DIAGNOSIS — E119 Type 2 diabetes mellitus without complications: Secondary | ICD-10-CM

## 2022-01-05 DIAGNOSIS — Z0000 Encounter for general adult medical examination without abnormal findings: Secondary | ICD-10-CM | POA: Diagnosis not present

## 2022-01-05 DIAGNOSIS — I10 Essential (primary) hypertension: Secondary | ICD-10-CM

## 2022-01-05 DIAGNOSIS — Z131 Encounter for screening for diabetes mellitus: Secondary | ICD-10-CM | POA: Diagnosis not present

## 2022-01-05 DIAGNOSIS — Z13220 Encounter for screening for lipoid disorders: Secondary | ICD-10-CM

## 2022-01-05 DIAGNOSIS — Z1159 Encounter for screening for other viral diseases: Secondary | ICD-10-CM

## 2022-01-05 DIAGNOSIS — Z1329 Encounter for screening for other suspected endocrine disorder: Secondary | ICD-10-CM | POA: Diagnosis not present

## 2022-01-05 DIAGNOSIS — Z1211 Encounter for screening for malignant neoplasm of colon: Secondary | ICD-10-CM

## 2022-01-05 DIAGNOSIS — R7989 Other specified abnormal findings of blood chemistry: Secondary | ICD-10-CM

## 2022-01-05 LAB — COMPREHENSIVE METABOLIC PANEL
ALT: 11 U/L (ref 0–35)
AST: 14 U/L (ref 0–37)
Albumin: 4.3 g/dL (ref 3.5–5.2)
Alkaline Phosphatase: 45 U/L (ref 39–117)
BUN: 11 mg/dL (ref 6–23)
CO2: 25 mEq/L (ref 19–32)
Calcium: 9.1 mg/dL (ref 8.4–10.5)
Chloride: 103 mEq/L (ref 96–112)
Creatinine, Ser: 0.74 mg/dL (ref 0.40–1.20)
GFR: 95.93 mL/min (ref >60.00)
Glucose, Bld: 103 mg/dL — ABNORMAL HIGH (ref 70–99)
Potassium: 3.6 mEq/L (ref 3.5–5.1)
Sodium: 136 mEq/L (ref 135–145)
Total Bilirubin: 0.5 mg/dL (ref 0.2–1.2)
Total Protein: 7.3 g/dL (ref 6.0–8.3)

## 2022-01-05 LAB — CBC
HCT: 35.4 % — ABNORMAL LOW (ref 36.0–46.0)
Hemoglobin: 11 g/dL — ABNORMAL LOW (ref 12.0–15.0)
MCHC: 31.1 g/dL (ref 30.0–36.0)
MCV: 67 fl — ABNORMAL LOW (ref 78.0–100.0)
Platelets: 218 10*3/uL (ref 150.0–400.0)
RBC: 5.29 Mil/uL — ABNORMAL HIGH (ref 3.87–5.11)
RDW: 18.4 % — ABNORMAL HIGH (ref 11.5–15.5)
WBC: 4.1 10*3/uL (ref 4.0–10.5)

## 2022-01-05 LAB — LIPID PANEL
Cholesterol: 164 mg/dL (ref 0–200)
HDL: 51 mg/dL (ref >39.00)
LDL Cholesterol: 102 mg/dL — ABNORMAL HIGH (ref 0–99)
NonHDL: 113.13
Total CHOL/HDL Ratio: 3
Triglycerides: 56 mg/dL (ref 0.0–149.0)
VLDL: 11.2 mg/dL (ref 0.0–40.0)

## 2022-01-05 LAB — TSH: TSH: 0.27 u[IU]/mL — ABNORMAL LOW (ref 0.35–5.50)

## 2022-01-05 LAB — HEMOGLOBIN A1C: Hgb A1c MFr Bld: 6.5 % (ref 4.6–6.5)

## 2022-01-05 LAB — POCT URINE PREGNANCY: Preg Test, Ur: NEGATIVE

## 2022-01-05 MED ORDER — CARVEDILOL 6.25 MG PO TABS: 6.2500 mg | ORAL_TABLET | Freq: Two times a day (BID) | ORAL | 1 refills | Status: DC

## 2022-01-05 MED ORDER — AMLODIPINE BESYLATE 5 MG PO TABS: 5.0000 mg | ORAL_TABLET | Freq: Every day | ORAL | 3 refills | Status: DC

## 2022-01-05 MED ORDER — MELOXICAM 15 MG PO TABS: 15.0000 mg | ORAL_TABLET | Freq: Every day | ORAL | 1 refills | Status: DC

## 2022-01-05 NOTE — Addendum Note (Signed)
Addended by: Lamar Blinks C on: 01/05/2022 06:05 PM ? ? Modules accepted: Orders ? ?

## 2022-01-06 ENCOUNTER — Other Ambulatory Visit: Payer: Self-pay | Admitting: Family Medicine

## 2022-01-06 DIAGNOSIS — R928 Other abnormal and inconclusive findings on diagnostic imaging of breast: Secondary | ICD-10-CM

## 2022-01-06 LAB — HEPATITIS C ANTIBODY
Hepatitis C Ab: NONREACTIVE
SIGNAL TO CUT-OFF: 0.1 (ref <1.00)

## 2022-01-07 ENCOUNTER — Encounter: Payer: Self-pay | Admitting: Family Medicine

## 2022-01-07 LAB — CYTOLOGY - PAP
Adequacy: ABSENT
Comment: NEGATIVE
Diagnosis: NEGATIVE
High risk HPV: NEGATIVE

## 2022-01-18 ENCOUNTER — Other Ambulatory Visit (INDEPENDENT_AMBULATORY_CARE_PROVIDER_SITE_OTHER): Payer: PRIVATE HEALTH INSURANCE

## 2022-01-18 ENCOUNTER — Encounter: Payer: Self-pay | Admitting: Family Medicine

## 2022-01-18 DIAGNOSIS — R7989 Other specified abnormal findings of blood chemistry: Secondary | ICD-10-CM

## 2022-01-18 LAB — TSH: TSH: 0.35 u[IU]/mL (ref 0.35–5.50)

## 2022-01-18 LAB — T3, FREE: T3, Free: 3.3 pg/mL (ref 2.3–4.2)

## 2022-02-18 ENCOUNTER — Encounter: Payer: Self-pay | Admitting: Family Medicine

## 2022-02-18 LAB — COLOGUARD: COLOGUARD: NEGATIVE

## 2022-03-16 ENCOUNTER — Ambulatory Visit: Payer: PRIVATE HEALTH INSURANCE

## 2022-03-16 ENCOUNTER — Ambulatory Visit
Admission: RE | Admit: 2022-03-16 | Discharge: 2022-03-16 | Disposition: A | Discharge status: Home / Self Care | Payer: PRIVATE HEALTH INSURANCE | Source: Ambulatory Visit | Attending: Family Medicine | Admitting: Family Medicine

## 2022-03-16 DIAGNOSIS — R928 Other abnormal and inconclusive findings on diagnostic imaging of breast: Secondary | ICD-10-CM

## 2022-03-27 NOTE — Progress Notes (Signed)
Cardiology Office Note   Date:  03/28/2022   ID:  LAURELLA TULL, DOB 1975-12-12, MRN [id number]  PCP:  Pearline Cables, MD  Cardiologist:   Dietrich Pates, MD   F/U for follow up of HTN      History of Present Illness: [name] is a 46 y.o. female with a history of palpitations, atypical CP and HTN  I saw her in Jan 2021  She saw C Pavarro in Jan 2022  Echo in 2018 showed LVEF normal   NOrmal valve function   Since  she was last seen, she denies palpiations, she denies CP   SHe says her breathing is OK    She reports that her BP readings are 130s to 150s/  Still working night shift  ALso going to school    Says this is not a permanent position.     Breakfast:   Sausage, fruit.   Greek yogurt with granola Lunch   Rice and chicken  No dinner   Occasional SSB   Current Meds  Medication Sig   Cholecalciferol 100 MCG (4000 UT) TABS Take 4,000 Units by mouth daily.   fluticasone (FLONASE) 50 MCG/ACT nasal spray SHAKE LIQUID AND USE 2 SPRAYS IN EACH NOSTRIL DAILY   meloxicam (MOBIC) 15 MG tablet TAKE 1 TABLET BY MOUTH DAILY AS NEEDED FOR KNEE PAIN   Multiple Vitamin (MULTIVITAMIN) tablet Take 1 tablet by mouth daily.   zinc gluconate 50 MG tablet Take 50 mg by mouth daily.   [DISCONTINUED] amLODipine (NORVASC) 5 MG tablet Take 1 tablet (5 mg total) by mouth daily. Please make overdue appt with Dr. Tenny Craw before anymore refills. Thank you 2nd attempt   [DISCONTINUED] carvedilol (COREG) 6.25 MG tablet Take 1 tablet (6.25 mg total) by mouth 2 (two) times daily with a meal.   [DISCONTINUED] famotidine (PEPCID) 40 MG tablet Take 1 tablet (40 mg total) by mouth daily.     Allergies:   Amoxicillin   Past Medical History:  Diagnosis Date   Beta thalassemia trait    Essential hypertension, benign 06/12/2012   [facility] also started on two  new meds for this see med list.   No pertinent past medical history     Past Surgical History:   Procedure Laterality Date   CESAREAN SECTION     CESAREAN SECTION  04/17/2012   Procedure: CESAREAN SECTION;  Surgeon: Brock Bad, MD;  Location: [facility];  Service: Gynecology;  Laterality: N/A;     Social History:  The patient  reports that she has never smoked. She has never used smokeless tobacco. She reports that she does not drink alcohol and does not use drugs.   Family History:  The patient's family history includes Hypertension in her mother; Sickle cell anemia in her brother.    ROS:  Please see the history of present illness. All other systems are reviewed and  Negative to the above problem except as noted.    PHYSICAL EXAM: VS:  BP 134/88   Pulse 87   Ht 5\' 5"  (1.651 m)   Wt 229 lb (103.9 kg)   SpO2 99%   BMI 38.11 kg/m    GEN: Obese 46 yo in no acute distress  HEENT: normal  Neck: JVP is normal  No  carotid bruits Cardiac: RRR;  No murmurs  No LE edema  Respiratory:  clear to auscultation bilaterally,  Chest:  Nontender GI: soft, nontender, nondistended, + BS  No hepatomegaly  MS: no deformity Moving all extremities   Skin: warm and dry, no rash Neuro:  Strength and sensation are intact Psych: euthymic mood, full affect   EKG:  EKG is ordered today    SR 91 bpm     Lipid Panel    Component Value Date/Time   CHOL 164 01/05/2022 0934   CHOL 182 08/21/2020 1003   TRIG 56.0 01/05/2022 0934   HDL 51.00 01/05/2022 0934   HDL 59 08/21/2020 1003   CHOLHDL 3 01/05/2022 0934   VLDL 11.2 01/05/2022 0934   LDLCALC 102 (H) 01/05/2022 0934   LDLCALC 109 (H) 08/21/2020 1003      Wt Readings from Last 3 Encounters:  03/28/22 229 lb (103.9 kg)  01/05/22 231 lb 6.4 oz (105 kg)  12/08/21 232 lb (105.2 kg)      ASSESSMENT AND PLAN:  1  HTN   BP is not optimal   I spent time reviewing why important     Will increase amlodipine to 5 bid    Increase carvedilol to 12.5 bid   2   CP   Denies     3  Palpitations   Denies      4  HCM  LDL 102   HDL 51       5  Obesity  Discussed diet   Limit sugar, carbs    Current medicines are reviewed at length with the patient today.  The patient does not have concerns regarding medicines.  Signed, Dietrich Pates, MD  03/28/2022 8:17 PM    [facility] [address] [place], [place], [place]  [address] Phone: [phone]; Fax: [phone]

## 2022-03-28 ENCOUNTER — Encounter: Payer: Self-pay | Admitting: Internal Medicine

## 2022-03-28 ENCOUNTER — Ambulatory Visit: Payer: PRIVATE HEALTH INSURANCE | Admitting: Internal Medicine

## 2022-03-28 DIAGNOSIS — I10 Essential (primary) hypertension: Secondary | ICD-10-CM

## 2022-03-28 MED ORDER — FAMOTIDINE 40 MG PO TABS: 40.0000 mg | ORAL_TABLET | Freq: Every day | ORAL | 3 refills | Status: DC

## 2022-03-28 MED ORDER — CARVEDILOL 12.5 MG PO TABS: 12.5000 mg | ORAL_TABLET | Freq: Two times a day (BID) | ORAL | 3 refills | Status: DC

## 2022-03-28 MED ORDER — AMLODIPINE BESYLATE 5 MG PO TABS: 5.0000 mg | ORAL_TABLET | Freq: Two times a day (BID) | ORAL | 3 refills | Status: DC

## 2022-03-28 MED ORDER — FAMOTIDINE 40 MG PO TABS: 40.0000 mg | ORAL_TABLET | Freq: Every day | ORAL | 3 refills | Status: AC

## 2022-03-28 NOTE — Patient Instructions (Signed)
Medication Instructions:  Take Pepcid 40 mg as needed for acid reflux Increase Amlodipine to 5 mg twice a day  Increase Carvedilol to 12.5 mg twice a day   *If you need a refill on your cardiac medications before your next appointment, please call your pharmacy*   Lab Work:  If you have labs (blood work) drawn today and your tests are completely normal, you will receive your results only by: MyChart Message (if you have MyChart) OR A paper copy in the mail If you have any lab test that is abnormal or we need to change your treatment, we will call you to review the results.   Testing/Procedures:    Follow-Up: At [facility], you and your health needs are our priority.  As part of our continuing mission to provide you with exceptional heart care, we have created designated Provider Care Teams.  These Care Teams include your primary Cardiologist (physician) and Advanced Practice Providers (APPs -  Physician Assistants and Nurse Practitioners) who all work together to provide you with the care you need, when you need it.  We recommend signing up for the patient portal called "MyChart".  Sign up information is provided on this After Visit Summary.  MyChart is used to connect with patients for Virtual Visits (Telemedicine).  Patients are able to view lab/test results, encounter notes, upcoming appointments, etc.  Non-urgent messages can be sent to your provider as well.   To learn more about what you can do with MyChart, go to [email or web address].    Your next appointment:   3 month(s)  The format for your next appointment:   In Person  Dr Dietrich Pates  If primary card or EP is not listed click here to update    :1}    Other Instructions   Important Information About Sugar

## 2022-03-30 ENCOUNTER — Telehealth: Payer: Self-pay

## 2022-03-30 DIAGNOSIS — I10 Essential (primary) hypertension: Secondary | ICD-10-CM

## 2022-03-30 NOTE — Telephone Encounter (Signed)
**Note De-Identified Faithanne Verret Obfuscation** The pt had a f/u with Dr Tenny Craw on 7/24 and was advised to increase her Amlodipine to 5 mg BID due to HTN (BP is not optimal).  The pts Ins plan does not want to cover BID. Will forward to Dr Tenny Craw for advisement.

## 2022-03-31 NOTE — Telephone Encounter (Signed)
**Note De-Identified Pauleen Goleman Obfuscation** Pricilla Riffle, MD  You 18 hours ago (2:56 PM)    That is fine   10 daily

## 2022-03-31 NOTE — Telephone Encounter (Signed)
**Note De-Identified Charlottie Peragine Obfuscation** No answer so I left a VM asking the pt to call Larita Fife at Dr Charlott Rakes office at [facility] at [phone].

## 2022-04-05 MED ORDER — AMLODIPINE BESYLATE 10 MG PO TABS: 10.0000 mg | ORAL_TABLET | Freq: Every day | ORAL | 1 refills | Status: DC

## 2022-04-05 NOTE — Telephone Encounter (Signed)
**Note De-Identified Ravinder Lukehart Obfuscation** No answer so I left a detailed message on the pts VM (ok per DPR) advising her that her insurance plan will not cover Amlodipine 5 mg to be taken BID and that I have s/w Dr Tenny Craw and she advises that the pt take 10 mg daily instead. I also advised in the VM that I have e-scribed her new Amlodipine 10 mg #90 to [facility] pharmacy to fill and I asked that she call us back if she has any questions or concerns.

## 2022-05-08 NOTE — Patient Instructions (Incomplete)
Good to see you today- I will be in touch with your labs asap Please be sure to get your eyes examined every year due to diabetes Assuming all is ok we can plan to recheck in about 6 months   You might contact Dr Merlyn Albert office about your veins Address: [address] [place], [place], [place] [address] Phone: [phone] Flu shot today

## 2022-05-08 NOTE — Progress Notes (Unsigned)
[facility] [address] [place], [place] [address] [phone]  Date:  05/11/2022   Name:  [name]   DOB:  Dec 05, 1975   MRN:  [id number]  PCP:  Pearline Cables, MD    Chief Complaint: No chief complaint on file.   History of Present Illness:  [name] is a 46 y.o. very pleasant female patient who presents with the following:  Seen today for recheck/ follow-up- HTN, DM, beta thalassemia  Most recent visit with myself was a CPE in May Per lab notes at that time:  Hemoglobin A1c is mildly elevated, I am afraid this does give you a diagnosis of diabetes I would suggest starting on oral medication to help bring down your blood sugars-typically would begin with metformin.  Please let me know if this would be okay with you.  I also encourage you to work on diet and exercise as best you are able Your cholesterol is not bad.  However, with diabetes using a cholesterol medication can help prevent heart attack or stroke later on.  I am also glad to start a cholesterol medication if you would like   She did not wish to start on lipid or blood sugar lowering meds at that time- will recheck labs today   Foot exam Ee exam Uring micro Colon cancer screening Flu shot   She has been to see Dr Tenny Craw for her BP in July: 1  HTN   BP is not optimal   I spent time reviewing why important     Will increase amlodipine to 5 bid    Increase carvedilol to 12.5 bid  2   CP   Denies    3  Palpitations   Denies      Patient Active Problem List   Diagnosis Date Noted   Controlled type 2 diabetes mellitus without complication, without long-term current use of insulin (HCC) 01/05/2022   Cough 09/14/2016   Heart murmur 09/02/2015   Benign essential HTN 03/23/2015   Habitual aborter, 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   No pertinent past medical history     Past Surgical History:  Procedure Laterality Date   CESAREAN SECTION     CESAREAN SECTION  04/17/2012   Procedure: CESAREAN SECTION;  Surgeon: Brock Bad, MD;  Location: [facility];  Service: Gynecology;  Laterality: N/A;    Social History   Tobacco Use   Smoking status: Never   Smokeless tobacco: Never  Vaping Use   Vaping Use: Never used  Substance Use Topics   Alcohol use: No   Drug use: No    Family History  Problem Relation Age of Onset   Sickle cell anemia Brother    Hypertension Mother     Allergies  Allergen Reactions   Amoxicillin Rash    Medication list has been reviewed and updated.  Current Outpatient Medications on File Prior to Visit  Medication Sig Dispense Refill   amLODipine (NORVASC) 10 MG tablet Take 1 tablet (10 mg total) by mouth daily. 90 tablet 1   carvedilol (COREG) 12.5 MG tablet Take 1 tablet (  12.5 mg total) by mouth 2 (two) times daily with a meal. 180 tablet 3   Cholecalciferol 100 MCG (4000 UT) TABS Take 4,000 Units by mouth daily.     famotidine (PEPCID) 40 MG tablet Take 1 tablet (40 mg total) by mouth daily. 100 tablet 3   fluticasone (FLONASE) 50 MCG/ACT nasal spray SHAKE LIQUID AND USE 2 SPRAYS IN EACH NOSTRIL DAILY 48 g 2   meloxicam (MOBIC) 15 MG tablet TAKE 1 TABLET BY MOUTH DAILY AS NEEDED FOR KNEE PAIN 90 tablet 0   Multiple Vitamin (MULTIVITAMIN) tablet Take 1 tablet by mouth daily.     zinc gluconate 50 MG tablet Take 50 mg by mouth daily.     No current facility-administered medications on file prior to visit.    Review of Systems:  As per HPI- otherwise negative.   Physical Examination: There were no vitals filed for this visit. There were no vitals filed for this  visit. There is no height or weight on file to calculate BMI. Ideal Body Weight:    GEN: no acute distress. HEENT: Atraumatic, Normocephalic.  Ears and Nose: No external deformity. CV: RRR, No M/G/R. No JVD. No thrill. No extra heart sounds. PULM: CTA B, no wheezes, crackles, rhonchi. No retractions. No resp. distress. No accessory muscle use. ABD: S, NT, ND, +BS. No rebound. No HSM. EXTR: No c/c/e PSYCH: Normally interactive. Conversant.    Assessment and Plan: ***  Signed Abbe Amsterdam, MD

## 2022-05-11 ENCOUNTER — Ambulatory Visit (INDEPENDENT_AMBULATORY_CARE_PROVIDER_SITE_OTHER): Payer: PRIVATE HEALTH INSURANCE | Admitting: Family Medicine

## 2022-05-11 ENCOUNTER — Encounter: Payer: Self-pay | Admitting: Family Medicine

## 2022-05-11 VITALS — BP 118/64 | HR 78 | Temp 97.9°F | Resp 18 | Ht 65.0 in | Wt 225.0 lb

## 2022-05-11 DIAGNOSIS — R7989 Other specified abnormal findings of blood chemistry: Secondary | ICD-10-CM

## 2022-05-11 DIAGNOSIS — I10 Essential (primary) hypertension: Secondary | ICD-10-CM | POA: Diagnosis not present

## 2022-05-11 DIAGNOSIS — E119 Type 2 diabetes mellitus without complications: Secondary | ICD-10-CM

## 2022-05-11 DIAGNOSIS — Z23 Encounter for immunization: Secondary | ICD-10-CM

## 2022-05-11 LAB — BASIC METABOLIC PANEL
BUN: 15 mg/dL (ref 6–23)
CO2: 26 mEq/L (ref 19–32)
Calcium: 9.7 mg/dL (ref 8.4–10.5)
Chloride: 102 mEq/L (ref 96–112)
Creatinine, Ser: 0.74 mg/dL (ref 0.40–1.20)
GFR: 95.69 mL/min (ref >60.00)
Glucose, Bld: 96 mg/dL (ref 70–99)
Potassium: 3.7 mEq/L (ref 3.5–5.1)
Sodium: 137 mEq/L (ref 135–145)

## 2022-05-11 LAB — MICROALBUMIN / CREATININE URINE RATIO
Creatinine,U: 187.2 mg/dL
Microalb Creat Ratio: 0.9 mg/g (ref 0.0–30.0)
Microalb, Ur: 1.7 mg/dL (ref 0.0–1.9)

## 2022-05-11 LAB — LIPID PANEL
Cholesterol: 175 mg/dL (ref 0–200)
HDL: 53.1 mg/dL (ref >39.00)
LDL Cholesterol: 108 mg/dL — ABNORMAL HIGH (ref 0–99)
NonHDL: 121.63
Total CHOL/HDL Ratio: 3
Triglycerides: 68 mg/dL (ref 0.0–149.0)
VLDL: 13.6 mg/dL (ref 0.0–40.0)

## 2022-05-11 LAB — TSH: TSH: 0.35 u[IU]/mL (ref 0.35–5.50)

## 2022-05-11 LAB — HEMOGLOBIN A1C: Hgb A1c MFr Bld: 6.8 % — ABNORMAL HIGH (ref 4.6–6.5)

## 2022-05-17 ENCOUNTER — Ambulatory Visit: Payer: PRIVATE HEALTH INSURANCE | Admitting: Internal Medicine

## 2022-05-24 ENCOUNTER — Ambulatory Visit: Payer: PRIVATE HEALTH INSURANCE | Admitting: Nurse Practitioner

## 2022-05-24 VITALS — BP 118/80 | HR 70

## 2022-05-24 DIAGNOSIS — Z789 Other specified health status: Secondary | ICD-10-CM

## 2022-05-24 NOTE — Progress Notes (Signed)
Office Visit  Subjective:  Patient ID: [name], female    DOB: 15-Apr-1976  Age: 46 y.o. MRN: [id number]  CC: wellness exam   HPI [name] presents for wellness exam visit for insurance benefit.  Patient has a PCP: Dr Lorelei Pont PMH significant for: HTN, New diagnosis of T2DM working on lifestyle changes Last labs per PCP were completed: September 2023  Health Maintenance:  Colonoscopy: does cologuard every 3 years (June 2023) Mammogram: July 2023  Smoker:Never  Immunizations:  Shingrix-  starting at age 86.  Tdap: 07/11/2024 COVID- x 3  Lifestyle: Diet- working on low carb diet Exercise- Stays active at work. No dedicated time for exercise.      Past Medical History:  Diagnosis Date   Beta thalassemia trait    Essential hypertension, benign 06/12/2012   [facility] note also started on two  new meds for this see med list.   No pertinent past medical history     Past Surgical History:  Procedure Laterality Date   CESAREAN SECTION     CESAREAN SECTION  04/17/2012   Procedure: CESAREAN SECTION;  Surgeon: Shelly Bombard, MD;  Location: [facility];  Service: Gynecology;  Laterality: N/A;    Outpatient Medications Prior to Visit  Medication Sig Dispense Refill   amLODipine (NORVASC) 10 MG tablet Take 1 tablet (10 mg total) by mouth daily. 90 tablet 1   carvedilol (COREG) 12.5 MG tablet Take 1 tablet (12.5 mg total) by mouth 2 (two) times daily with a meal. 180 tablet 3   Cholecalciferol 100 MCG (4000 UT) TABS Take 4,000 Units by mouth daily.     famotidine (PEPCID) 40 MG tablet Take 1 tablet (40 mg total) by mouth daily. 100 tablet 3   fluticasone (FLONASE) 50 MCG/ACT nasal spray SHAKE LIQUID AND USE 2 SPRAYS IN EACH NOSTRIL DAILY 48 g 2   meloxicam (MOBIC) 15 MG tablet TAKE 1 TABLET BY MOUTH DAILY AS NEEDED FOR KNEE PAIN 90 tablet 0   Multiple Vitamin (MULTIVITAMIN) tablet Take 1 tablet by mouth daily.     zinc gluconate 50 MG  tablet Take 50 mg by mouth daily.     No facility-administered medications prior to visit.    ROS Review of Systems  Respiratory:  Negative for shortness of breath.   Cardiovascular:  Negative for chest pain and leg swelling.  Gastrointestinal:  Negative for constipation and diarrhea.  Musculoskeletal:  Positive for arthralgias.  Neurological:  Negative for headaches.    Objective:  BP 118/80   Pulse 70   SpO2 99%   Physical Exam Constitutional:      General: She is not in acute distress. HENT:     Head: Normocephalic.  Cardiovascular:     Rate and Rhythm: Normal rate and regular rhythm.     Heart sounds: Normal heart sounds.  Pulmonary:     Effort: Pulmonary effort is normal.     Breath sounds: Normal breath sounds.  Musculoskeletal:        General: Normal range of motion.  Skin:    General: Skin is warm.  Neurological:     General: No focal deficit present.     Mental Status: She is alert and oriented to person, place, and time.  Psychiatric:        Mood and Affect: Mood normal.        Behavior: Behavior normal.      Assessment & Plan:    [name] was seen today  for wellness exam.  Diagnoses and all orders for this visit:  Participant in health and wellness plan Adult wellness physical was conducted today. Importance of diet and exercise were discussed in detail.  We reviewed immunizations and gave recommendations regarding current immunization needed for age.  Preventative health exams up to date.   Patient was advised yearly wellness exam    No orders of the defined types were placed in this encounter.   No orders of the defined types were placed in this encounter.   Follow-up:As needed.

## 2022-06-07 ENCOUNTER — Encounter: Payer: Self-pay | Admitting: Family

## 2022-06-07 ENCOUNTER — Ambulatory Visit: Payer: PRIVATE HEALTH INSURANCE | Admitting: Family

## 2022-06-07 VITALS — BP 160/60 | HR 97 | Temp 98.2°F | Ht 65.0 in | Wt 226.8 lb

## 2022-06-07 DIAGNOSIS — R21 Rash and other nonspecific skin eruption: Secondary | ICD-10-CM

## 2022-06-07 MED ORDER — PREDNISONE 20 MG PO TABS: ORAL_TABLET | ORAL | 0 refills | Status: DC

## 2022-06-07 MED ORDER — HYDROXYZINE PAMOATE 25 MG PO CAPS: 25.0000 mg | ORAL_CAPSULE | Freq: Three times a day (TID) | ORAL | 0 refills | Status: DC | PRN

## 2022-06-07 MED ORDER — METHYLPREDNISOLONE ACETATE 40 MG/ML IJ SUSP
40.0000 mg | Freq: Once | INTRAMUSCULAR | Status: AC
  Administered 2022-06-07: 40 mg via INTRAMUSCULAR

## 2022-06-07 NOTE — Progress Notes (Signed)
[name] is a 46 y.o. 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 90 tablet 1   carvedilol (COREG) 12.5 MG tablet Take 1 tablet (12.5 mg total) by mouth 2 (two) times daily with a meal. 180 tablet 3   Cholecalciferol 100 MCG (4000 UT) TABS Take 4,000 Units by mouth daily.     famotidine (PEPCID) 40 MG tablet Take 1 tablet (40 mg total) by mouth daily. 100 tablet 3   fluticasone (FLONASE) 50 MCG/ACT nasal spray SHAKE LIQUID AND USE 2 SPRAYS IN EACH NOSTRIL DAILY 48 g 2   hydrOXYzine (VISTARIL) 25 MG capsule Take 1 capsule (25 mg total) by mouth every 8 (eight) hours as needed for itching. 30 capsule 0   meloxicam (MOBIC) 15 MG tablet TAKE 1 TABLET BY MOUTH DAILY AS NEEDED FOR KNEE PAIN 90 tablet 0   Multiple Vitamin (MULTIVITAMIN) tablet Take 1 tablet by mouth daily.     predniSONE (DELTASONE) 20 MG tablet Take 2 tablets po qd x 2 days, then 1 tablet per day x 5 days 9 tablet 0   zinc gluconate 50 MG tablet Take 50 mg by mouth daily.     No current facility-administered medications for this visit.    Allergies: Beet [beta vulgaris] and Amoxicillin  Past Medical History:  Diagnosis Date   Beta thalassemia trait    Essential hypertension, benign 06/12/2012   The [facility] abstract note also started on two  new meds for this see med list.   No pertinent past medical history     Past Surgical History:   Procedure Laterality Date   CESAREAN SECTION     CESAREAN SECTION  04/17/2012   Procedure: CESAREAN SECTION;  Surgeon: Brock Bad, MD;  Location: [facility];  Service: Gynecology;  Laterality: N/A;    Family History  Problem Relation Age of Onset   Sickle cell anemia Brother    Hypertension Mother     Social History   Tobacco Use   Smoking status: Never   Smokeless tobacco: Never  Substance Use Topics   Alcohol use: No    Subjective:   Rash on body x 1 week; + itching; did start one day after drinking new weight loss drink which has beets in it; per patient, she has never had beets before; limited relief with Benadryl; no family members with similar symptoms;     Objective:  Vitals:   06/07/22 1118  BP: (!) 160/60  Pulse: 97  Temp: 98.2 F (36.8 C)  TempSrc: Oral  SpO2: 99%  Weight: 226 lb 12.8 oz (102.9 kg)  Height: 5\' 5"  (1.651 m)    General: Well developed, well nourished, in no acute distress  Skin : Warm and dry. Papular lesions noted on upper extremities, chest, upper back;  Head: Normocephalic and atraumatic  Lungs: Respirations unlabored;  Neurologic: Alert and oriented; speech intact; face symmetrical; moves all extremities well; CNII-XII intact without focal deficit  Assessment:  1. Rash     Plan:  ? Allergic reaction to beet drink; Depo-Medrol IM 40 mg given today; start oral prednisone- take as directed; continue to use Pepcid and start Atarax for itching; follow up worse, no better.   No follow-ups on file.  No orders of the defined types were placed in this encounter.   Requested Prescriptions   Signed Prescriptions Disp Refills   hydrOXYzine (VISTARIL) 25 MG capsule 30 capsule 0    Sig: Take 1 capsule (25 mg total) by mouth every 8 (eight) hours as needed for itching.   predniSONE (DELTASONE) 20 MG tablet 9 tablet 0    Sig: Take 2 tablets po qd x 2 days, then 1 tablet per day x 5 days

## 2022-06-13 ENCOUNTER — Ambulatory Visit: Payer: PRIVATE HEALTH INSURANCE | Admitting: Family Medicine

## 2022-06-13 VITALS — BP 140/85 | HR 96 | Temp 98.0°F | Resp 16 | Ht 65.0 in | Wt 222.0 lb

## 2022-06-13 DIAGNOSIS — L42 Pityriasis rosea: Secondary | ICD-10-CM | POA: Diagnosis not present

## 2022-06-13 DIAGNOSIS — R21 Rash and other nonspecific skin eruption: Secondary | ICD-10-CM | POA: Diagnosis not present

## 2022-06-13 MED ORDER — TRIAMCINOLONE ACETONIDE 0.1 % EX CREA: 1.0000 | TOPICAL_CREAM | Freq: Two times a day (BID) | CUTANEOUS | 0 refills | Status: DC

## 2022-06-13 NOTE — Patient Instructions (Addendum)
It was good to see you again today!  I think you likely have pityriasis rosea which is a self- limited rash, we think caused by a viral infection However, we will check some labs to look for any other cause, and also see if this could be chicken pox!   Use the atarax as needed for itching, and the steroid cream as needed Sunlight exposure may help!   Please let me know if you are getting worse or have any other symptoms

## 2022-06-13 NOTE — Progress Notes (Signed)
Healthcare at [facility] [address] [place], [place] [address] [phone]  Date:  06/13/2022   Name:  [name]   DOB:  [birth year]   MRN:  [id number]  PCP:  Regina Cables, MD    Chief Complaint: Rash (Here for Rash)   History of Present Illness:  [name] is a 46 y.o. very pleasant female patient who presents with the following:  Patient seen today with concern of a rash-history of HTN, DM, beta thalassemia  Most recent visit with myself was September 6 for routine recheck Diabetes is currently diet controlled Lab Results  Component Value Date   HGBA1C 6.8 (H) 05/11/2022   She was seen by my partner Ria Clock on 10/3 with concern of a rash-[name] noted papular lesions on her upper extremities, chest and upper back Thought possibly due to a new beet supplements she was using-patient reports she also got a new laundry product, wise no new foods, medications, or allergens  Last visit she was given Depo-Medrol, oral prednisone, Pepcid and Atarax She has stopped using the beet supplement  She feels like atarax helps her with itching She is finished with the steroids as of yesterday  [name] feels fine except her rash is quite itchy No fever or chills, no body aches or malaise, no genital or oral lesions  Patient Active Problem List   Diagnosis Date Noted   Controlled type 2 diabetes mellitus without complication, without long-term current use of insulin (HCC) 01/05/2022   Cough 09/14/2016   Heart murmur 09/02/2015   Benign essential HTN 03/23/2015   Habitual aborter, 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   No pertinent past medical history     Past Surgical History:  Procedure Laterality Date   CESAREAN SECTION     CESAREAN SECTION  04/17/2012   Procedure: CESAREAN SECTION;  Surgeon: Brock Bad, MD;  Location: [facility];  Service: Gynecology;  Laterality: N/A;    Social History   Tobacco Use   Smoking status: Never   Smokeless tobacco: Never  Vaping Use   Vaping Use: Never used  Substance Use Topics   Alcohol use: No   Drug use: No    Family History  Problem Relation Age of Onset   Sickle cell anemia Brother    Hypertension Mother     Allergies  Allergen Reactions   Beet [Beta Vulgaris]    Amoxicillin Rash    Medication list has been reviewed and updated.  Current Outpatient Medications on File Prior to Visit  Medication Sig Dispense Refill   amLODipine (NORVASC) 10 MG tablet Take 1 tablet (10 mg total) by mouth daily. 90 tablet 1   carvedilol (COREG) 12.5 MG tablet Take 1 tablet (12.5 mg total) by mouth 2 (two) times daily with a meal. 180 tablet 3   Cholecalciferol 100 MCG (4000 UT) TABS Take 4,000 Units by mouth daily.     famotidine (PEPCID) 40 MG tablet Take 1 tablet (40 mg total) by mouth daily. 100 tablet 3   fluticasone (FLONASE) 50 MCG/ACT nasal spray SHAKE LIQUID AND USE 2 SPRAYS IN EACH NOSTRIL DAILY  48 g 2   hydrOXYzine (VISTARIL) 25 MG capsule Take 1 capsule (25 mg total) by mouth every 8 (eight) hours as needed for itching. 30 capsule 0   meloxicam (MOBIC) 15 MG tablet TAKE 1 TABLET BY MOUTH DAILY AS NEEDED FOR KNEE PAIN 90 tablet 0   Multiple Vitamin (MULTIVITAMIN) tablet Take 1 tablet by mouth daily.     predniSONE (DELTASONE) 20 MG tablet Take 2 tablets po qd x 2 days, then 1 tablet per day x 5 days 9 tablet 0   zinc gluconate 50 MG tablet Take 50 mg by mouth daily.     No current facility-administered medications on file prior to visit.    Review of Systems:  As per HPI- otherwise negative.   Physical Examination: Vitals:    06/13/22 1516 06/13/22 1533  BP: (!) 150/66 (!) 140/85  Pulse: (!) 102 96  Resp: 16   Temp: 98 F (36.7 C)   SpO2: 98%    Vitals:   06/13/22 1516  Weight: 222 lb (100.7 kg)  Height: 5\' 5"  (1.651 m)   Body mass index is 36.94 kg/m. Ideal Body Weight: Weight in (lb) to have BMI = 25: 149.9  GEN: no acute distress.  Obese, looks well except for rash HEENT: Atraumatic, Normocephalic.  No oral lesions Ears and Nose: No external deformity. CV: RRR, No M/G/R. No JVD. No thrill. No extra heart sounds. PULM: CTA B, no wheezes, crackles, rhonchi. No retractions. No resp. distress. No accessory muscle use. EXTR: No c/c/e PSYCH: Normally interactive. Conversant.   Diffuse discrete papular macules over trunk, arms and legs-likely consistent with pityriasis rosea      Assessment and Plan: Pityriasis rosea - Plan: triamcinolone cream (KENALOG) 0.1 %, CBC, Comprehensive metabolic panel, HIV Antibody (routine testing w rflx), RPR, Varicella Zoster Abs, IgG/IgM  Rash and nonspecific skin eruption - Plan: Ambulatory referral to Allergy Patient seen with rash as above. It has been present for about 10 days now.  She otherwise feels well except for itching Suspect she has pityriasis rosea.  Provided triamcinolone cream gust supportive care.  We will also have her obtain further lab work-up as above to rule out syphilis, chickenpox, etc. I have asked her to let me know right away if any other symptoms are worsening She would like a referral to allergist for testing, placed order  Signed , MD  Addendum 10/10, received her labs as below.  Message to patient  Results for orders placed or performed in visit on 06/13/22  CBC  Result Value Ref Range   WBC 7.5 4.0 - 10.5 K/uL   RBC 4.72 3.87 - 5.11 Mil/uL   Platelets 297.0 150.0 - 400.0 K/uL   Hemoglobin 9.7 (L) 12.0 - 15.0 g/dL   HCT 08/13/22 (L) 00.9 - 38.1 %   MCV 65.8 (L) 78.0 - 100.0 fl   MCHC 31.1 30.0 - 36.0 g/dL   RDW  82.9 (H) 93.7 - 15.5 %  Comprehensive metabolic panel  Result Value Ref Range   Sodium 136 135 - 145 mEq/L   Potassium 4.0 3.5 - 5.1 mEq/L   Chloride 102 96 - 112 mEq/L   CO2 26 19 - 32 mEq/L   Glucose, Bld 149 (H) 70 - 99 mg/dL   BUN 13 6 - 23 mg/dL   Creatinine, Ser 16.9 0.40 - 1.20 mg/dL   Total Bilirubin 0.3 0.2 - 1.2 mg/dL   Alkaline Phosphatase 53 39 - 117 U/L   AST 9 0 -  37 U/L   ALT 9 0 - 35 U/L   Total Protein 7.4 6.0 - 8.3 g/dL   Albumin 4.1 3.5 - 5.2 g/dL   GFR 97.20 >60.00 mL/min   Calcium 9.5 8.4 - 10.5 mg/dL

## 2022-06-14 ENCOUNTER — Encounter: Payer: Self-pay | Admitting: Family Medicine

## 2022-06-14 DIAGNOSIS — R21 Rash and other nonspecific skin eruption: Secondary | ICD-10-CM

## 2022-06-14 LAB — COMPREHENSIVE METABOLIC PANEL
ALT: 9 U/L (ref 0–35)
AST: 9 U/L (ref 0–37)
Albumin: 4.1 g/dL (ref 3.5–5.2)
Alkaline Phosphatase: 53 U/L (ref 39–117)
BUN: 13 mg/dL (ref 6–23)
CO2: 26 mEq/L (ref 19–32)
Calcium: 9.5 mg/dL (ref 8.4–10.5)
Chloride: 102 mEq/L (ref 96–112)
Creatinine, Ser: 0.73 mg/dL (ref 0.40–1.20)
GFR: 97.2 mL/min (ref >60.00)
Glucose, Bld: 149 mg/dL — ABNORMAL HIGH (ref 70–99)
Potassium: 4 mEq/L (ref 3.5–5.1)
Sodium: 136 mEq/L (ref 135–145)
Total Bilirubin: 0.3 mg/dL (ref 0.2–1.2)
Total Protein: 7.4 g/dL (ref 6.0–8.3)

## 2022-06-14 LAB — CBC
HCT: 31.1 % — ABNORMAL LOW (ref 36.0–46.0)
Hemoglobin: 9.7 g/dL — ABNORMAL LOW (ref 12.0–15.0)
MCHC: 31.1 g/dL (ref 30.0–36.0)
MCV: 65.8 fl — ABNORMAL LOW (ref 78.0–100.0)
Platelets: 297 10*3/uL (ref 150.0–400.0)
RBC: 4.72 Mil/uL (ref 3.87–5.11)
RDW: 18 % — ABNORMAL HIGH (ref 11.5–15.5)
WBC: 7.5 10*3/uL (ref 4.0–10.5)

## 2022-06-14 LAB — HIV ANTIBODY (ROUTINE TESTING W REFLEX): HIV 1&2 Ab, 4th Generation: NONREACTIVE

## 2022-06-14 LAB — RPR: RPR Ser Ql: NONREACTIVE

## 2022-06-16 LAB — VARICELLA ZOSTER ABS, IGG/IGM
Varicella IgM: 1.18 index — ABNORMAL HIGH (ref 0.00–0.90)
Varicella zoster IgG: 2326 index (ref >165)

## 2022-06-16 MED ORDER — DOXYCYCLINE HYCLATE 100 MG PO CAPS: 100.0000 mg | ORAL_CAPSULE | Freq: Two times a day (BID) | ORAL | 0 refills | Status: DC

## 2022-06-17 ENCOUNTER — Encounter: Payer: Self-pay | Admitting: Family Medicine

## 2022-06-18 NOTE — Progress Notes (Unsigned)
[name] at [facility] [address] [place], [place] [address] [phone]  Date:  06/20/2022   Name:  [name]   DOB:  1976-06-07   MRN:  [id number]  PCP:  Darreld Mclean, MD    Chief Complaint: skin biopsy (Refill on Vistaril /Flu shot today: unsure /)   History of Present Illness:  [name] is a 46 y.o. very pleasant female patient who presents with the following:  Patient seen today to follow-up on a rash and have a punch biopsy See office visit notes 10/3 and 10/9 Suspect this rash may be pityriasis rosea, but rash has been very itchy and bothersome to patient.  Negative HIV and syphilis. Varicella-zoster IgM is weakly positive, but the rash is not typical of chickenpox-the face is spared.  Do not strongly suspect this is chickenpox  Patient get some relief with use of Vistaril for itching.  She tried the topical steroid but it did not really help.  She got perhaps some minimal improvement with oral prednisone previously.  She is taking doxycycline and thinks it may have helped her some  She otherwise feels fine, no fever or other systemic symptoms.  No oral lesions  Lab Results  Component Value Date   HGBA1C 6.8 (H) 05/11/2022     Patient Active Problem List   Diagnosis Date Noted   Controlled type 2 diabetes mellitus without complication, without long-term current use of insulin (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   No pertinent past medical history     Past Surgical  History:  Procedure Laterality Date   CESAREAN SECTION     CESAREAN SECTION  04/17/2012   Procedure: CESAREAN SECTION;  Surgeon: Shelly Bombard, MD;  Location: [facility];  Service: Gynecology;  Laterality: N/A;    Social History   Tobacco Use   Smoking status: Never   Smokeless tobacco: Never  Vaping Use   Vaping Use: Never used  Substance Use Topics   Alcohol use: No   Drug use: No    Family History  Problem Relation Age of Onset   Sickle cell anemia Brother    Hypertension Mother     Allergies  Allergen Reactions   Beet [Beta Vulgaris]    Amoxicillin Rash    Medication list has been reviewed and updated.  Current Outpatient Medications on File Prior to Visit  Medication Sig Dispense Refill   amLODipine (NORVASC) 10 MG tablet Take 1 tablet (10 mg total) by mouth daily. 90 tablet 1   carvedilol (COREG) 12.5 MG tablet Take 1 tablet (12.5 mg total) by mouth 2 (two) times daily with a meal. 180 tablet 3   Cholecalciferol 100 MCG (4000 UT) TABS Take 4,000 Units by mouth daily.     doxycycline (VIBRAMYCIN) 100 MG capsule Take 1 capsule (100 mg total) by mouth 2 (two) times daily. 20 capsule 0   famotidine (PEPCID) 40 MG tablet Take 1 tablet (40 mg total) by mouth daily. 100 tablet 3   fluticasone (  FLONASE) 50 MCG/ACT nasal spray SHAKE LIQUID AND USE 2 SPRAYS IN EACH NOSTRIL DAILY 48 g 2   meloxicam (MOBIC) 15 MG tablet TAKE 1 TABLET BY MOUTH DAILY AS NEEDED FOR KNEE PAIN 90 tablet 0   Multiple Vitamin (MULTIVITAMIN) tablet Take 1 tablet by mouth daily.     triamcinolone cream (KENALOG) 0.1 % Apply 1 Application topically 2 (two) times daily. Use as needed on rash 453 g 0   zinc gluconate 50 MG tablet Take 50 mg by mouth daily.     No current facility-administered medications on file prior to visit.    Review of Systems:  As per HPI- otherwise negative.   Physical Examination: Vitals:   06/20/22 1010  BP: 136/84  Pulse: 94  Resp: 18  Temp: 97.8 F (36.6 C)  SpO2:  99%   Vitals:   06/20/22 1010  Weight: 226 lb (102.5 kg)  Height: 5\' 5"  (1.651 m)   Body mass index is 37.61 kg/m. Ideal Body Weight: Weight in (lb) to have BMI = 25: 149.9  GEN: No acute distress; alert,appropriate. PULM: Breathing comfortably in no respiratory distress PSYCH: Normally interactive.   Patient has widespread discrete macules over her arms, trunk, legs.  Some of the silver scale on the superior body is clearing, it looks like these lesions are resolving.  The rash is now more active on her lower body and legs No pustules or vesicles  Verbal consent for punch biopsy obtained.  Explained potential risk of infection or bleeding-patient elects to proceed Prepped area on left lateral thigh Obtain anesthesia with approximately 1 mL of 1% lido with epi  Tempted a 2 mm punch biopsy but the sample was too small.  Proceeded to a 4 mm punch biopsy-successful sample, specimen to path Estimated blood loss 3 mL.  Patient tolerated well with no immediate complications Hemostasis obtained with pressure and silver nitrate sticks Patient declined suture for this biopsy procedure Bandage placed, wound care discussed   Assessment and Plan: Rash - Plan: Dermatology pathology, hydrOXYzine (VISTARIL) 25 MG capsule, predniSONE (DELTASONE) 20 MG tablet  As above, patient seen for punch biopsy of bothersome itchy rash.  We hope the biopsy results will help to guide therapy Refilled her hydroxyzine She would like to try prednisone again, this is reasonable She will let me know if getting worse or any other symptoms  Signed , MD

## 2022-06-18 NOTE — Patient Instructions (Incomplete)
It was good to see you again today, I will be in touch with the results of soon as possible Keep the biopsy area clean and dry today, tomorrow you can shower as normal and apply a bandage as needed. If any sign of infection or other concerns please contact me right away.  If any bleeding apply firm pressure for 10 to 20 minutes, seek care if this does not resolve bleeding  Ok to continue hydroxyzine as needed for itching and we can try prednisone again!  Please let me know how you are doing

## 2022-06-20 ENCOUNTER — Ambulatory Visit: Payer: PRIVATE HEALTH INSURANCE | Admitting: Family Medicine

## 2022-06-20 VITALS — BP 136/84 | HR 94 | Temp 97.8°F | Resp 18 | Ht 65.0 in | Wt 226.0 lb

## 2022-06-20 DIAGNOSIS — L309 Dermatitis, unspecified: Secondary | ICD-10-CM

## 2022-06-20 DIAGNOSIS — R21 Rash and other nonspecific skin eruption: Secondary | ICD-10-CM

## 2022-06-20 MED ORDER — PREDNISONE 20 MG PO TABS: ORAL_TABLET | ORAL | 0 refills | Status: DC

## 2022-06-20 MED ORDER — HYDROXYZINE PAMOATE 25 MG PO CAPS: 25.0000 mg | ORAL_CAPSULE | Freq: Three times a day (TID) | ORAL | 1 refills | Status: DC | PRN

## 2022-06-21 IMAGING — MG MM DIGITAL SCREENING BILAT W/ TOMO AND CAD
8 series · 8 of 24 positions shown · non-contrast
Comparison: None available
COMPARISON: None available

Addendum:
CLINICAL DATA: Screening.

EXAM:
DIGITAL SCREENING BILATERAL MAMMOGRAM WITH TOMOSYNTHESIS AND CAD
TECHNIQUE: Bilateral screening digital craniocaudal and mediolateral oblique
mammograms were obtained. Bilateral screening digital breast
tomosynthesis was performed. The images were evaluated with
computer-aided detection.

[L CC synth-2D]
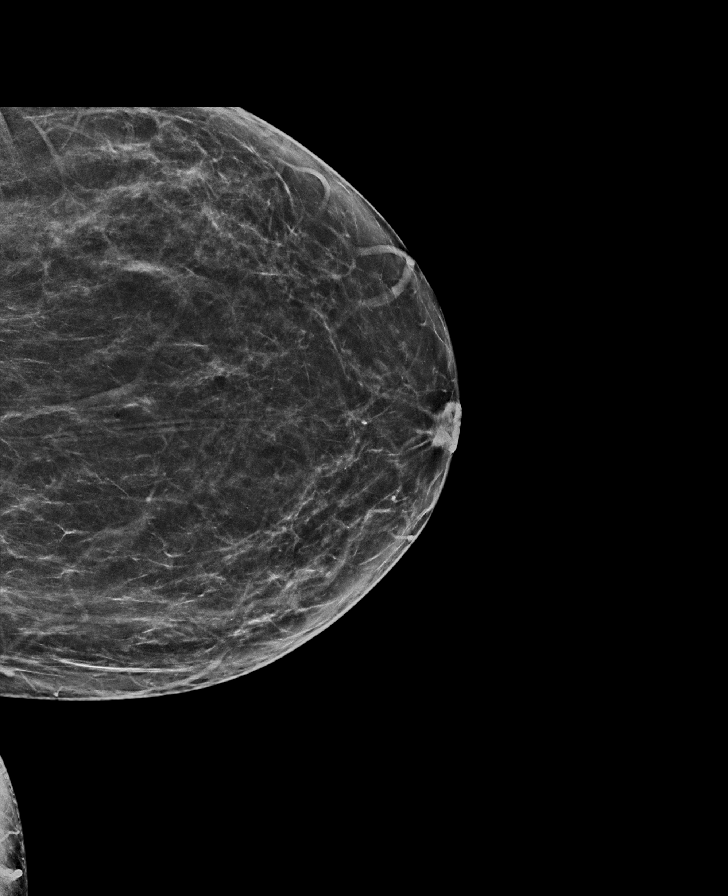

[R CC synth-2D]
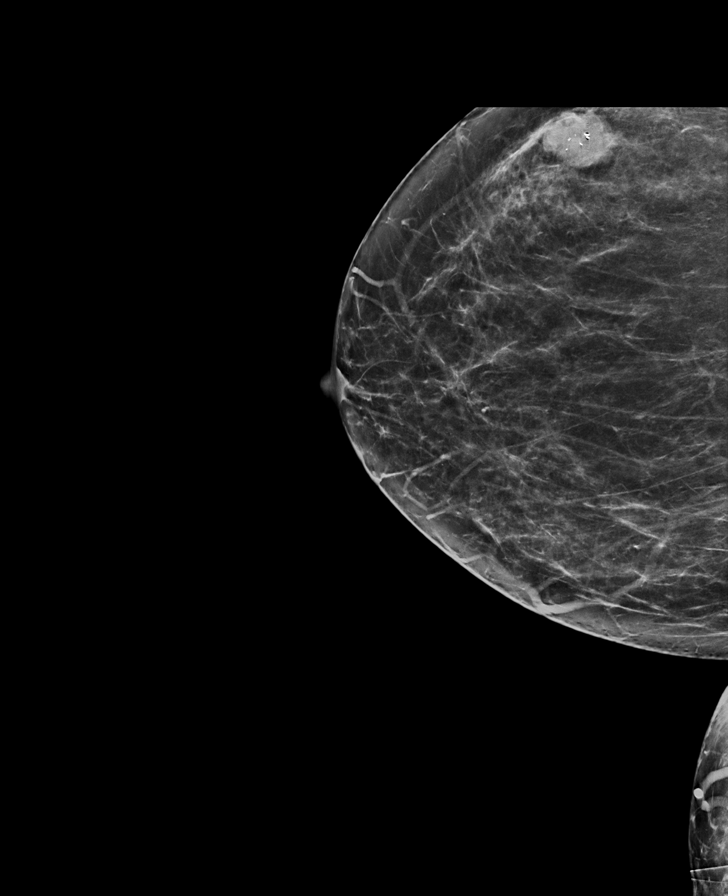

[R MLO synth-2D]
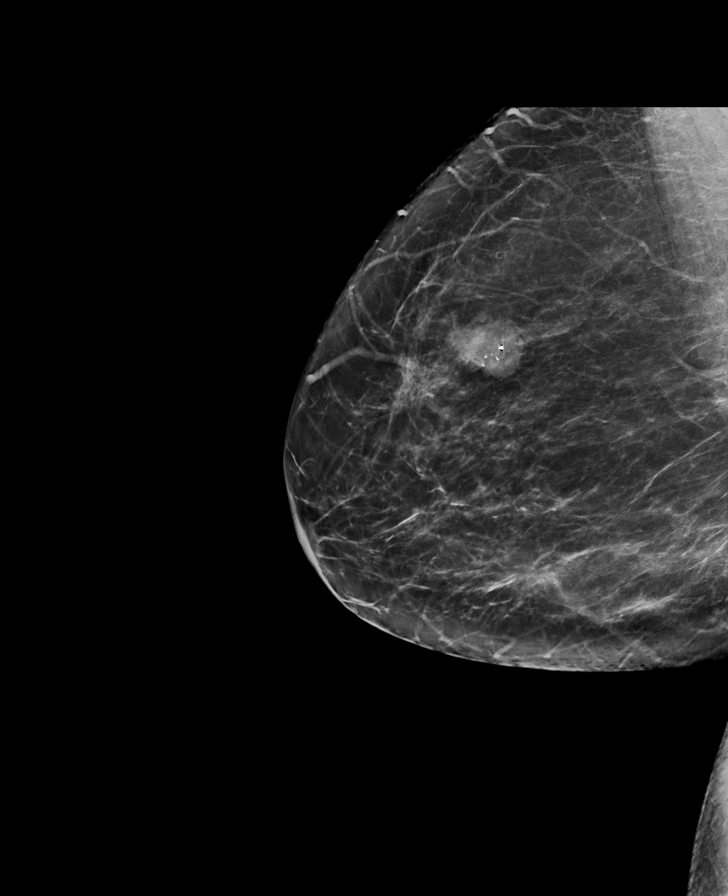

[L MLO synth-2D]
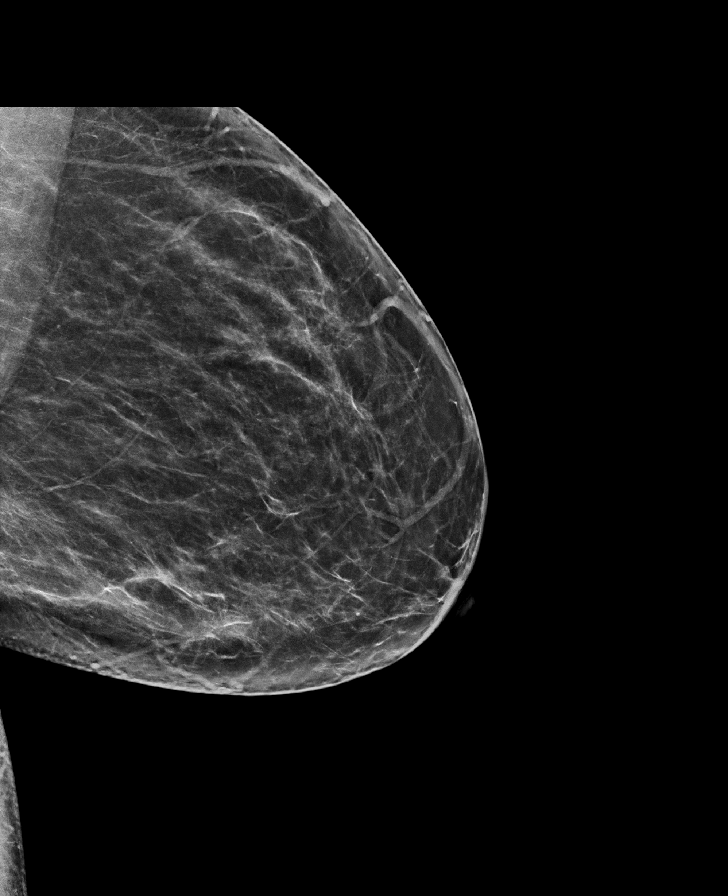

[L CC tomo · tomo slice 34/67.0]
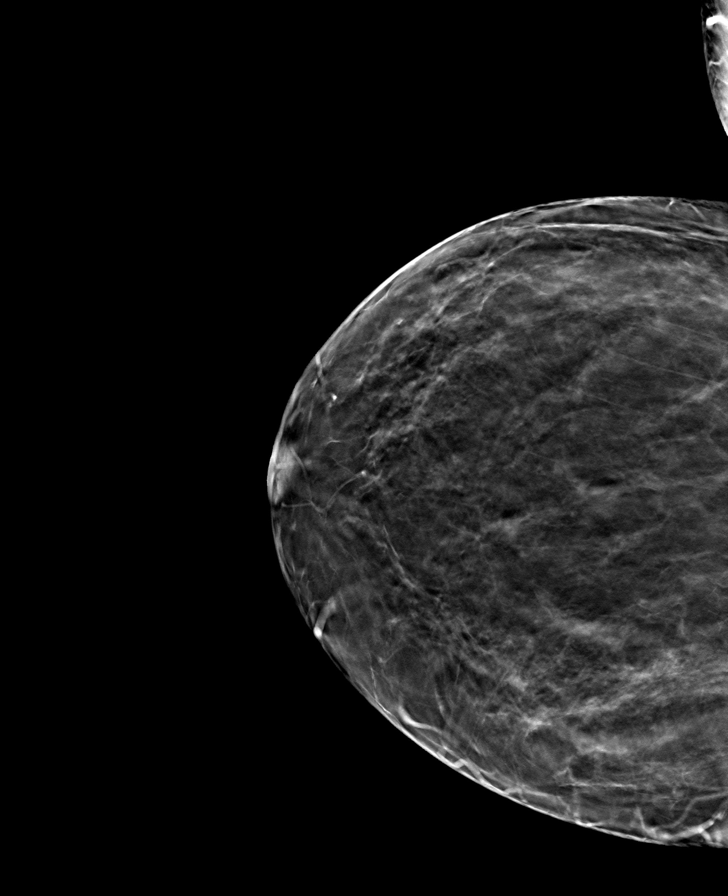

[L MLO tomo · tomo slice 39/77.0]
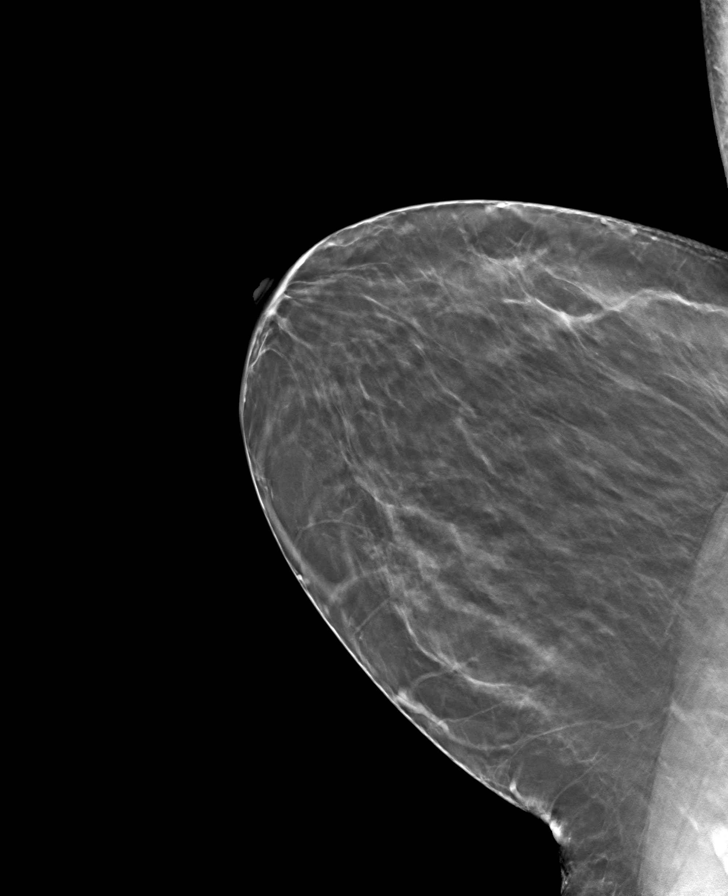

[R CC tomo · tomo slice 35/69.0]
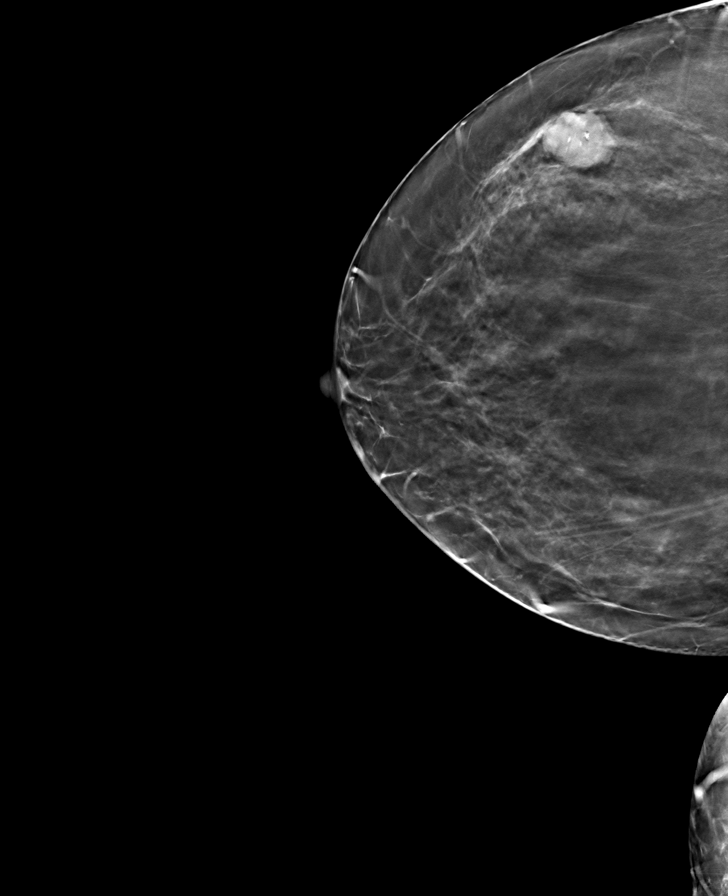

[R MLO tomo · tomo slice 42/83.0]
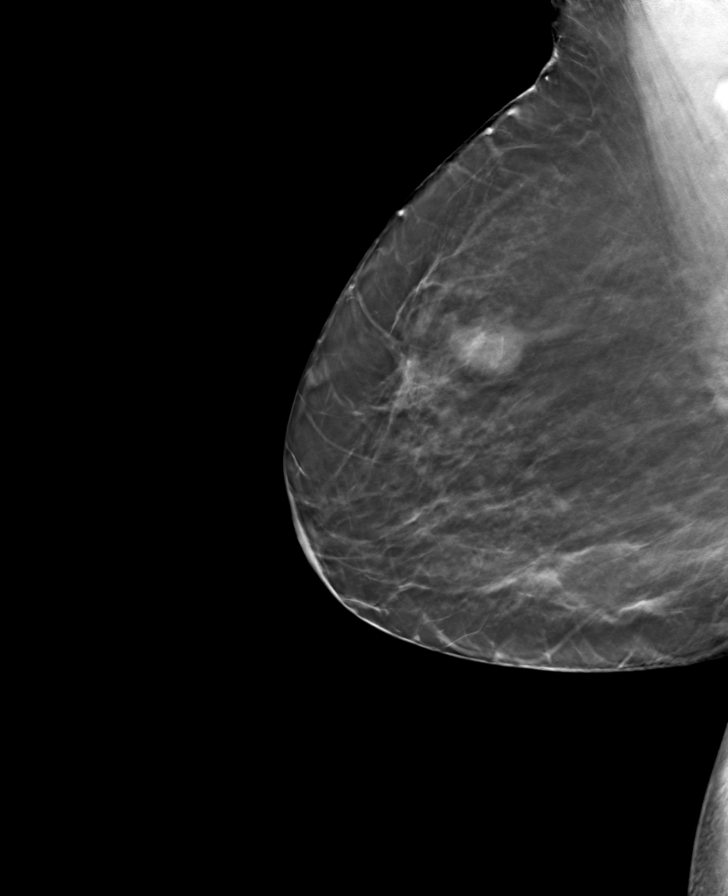

[8 of 24 positions shown; findings below may reference images not displayed]

ACR Breast Density Category b: There are scattered areas of
fibroglandular density.
FINDINGS: In the right breast, a possible mass and separate asymmetry warrant
further evaluation. In the left breast, no findings suspicious for
malignancy.
IMPRESSION: Further evaluation is suggested for a possible mass and possible
asymmetry in the right breast.

RECOMMENDATION:
Diagnostic mammogram and possibly ultrasound of the right breast.
(Code:2W-Q-AAC)

The patient will be contacted regarding the findings, and additional
imaging will be scheduled.

BI-RADS CATEGORY  0: Incomplete. Need additional imaging evaluation
and/or prior mammograms for comparison.

ADDENDUM:
Prior imaging is now available. There is a stable biopsied mass in
the upper outer right breast. There is a new asymmetry anterior to
this mass best seen on MLO view.

Recommend diagnostic right breast mammogram and possible ultrasound.

BI-RADS: 0-incomplete.  Need additional imaging evaluation.

*** End of Addendum ***
ACR Breast Density Category b: There are scattered areas of
fibroglandular density.
FINDINGS: In the right breast, a possible mass and separate asymmetry warrant
further evaluation. In the left breast, no findings suspicious for
malignancy.
IMPRESSION: Further evaluation is suggested for a possible mass and possible
asymmetry in the right breast.

RECOMMENDATION:
Diagnostic mammogram and possibly ultrasound of the right breast.
(Code:2W-Q-AAC)

The patient will be contacted regarding the findings, and additional
imaging will be scheduled.

BI-RADS CATEGORY  0: Incomplete. Need additional imaging evaluation
and/or prior mammograms for comparison.

## 2022-06-26 ENCOUNTER — Encounter: Payer: Self-pay | Admitting: Family Medicine

## 2022-07-27 ENCOUNTER — Ambulatory Visit (INDEPENDENT_AMBULATORY_CARE_PROVIDER_SITE_OTHER): Payer: PRIVATE HEALTH INSURANCE | Admitting: Internal Medicine

## 2022-07-27 ENCOUNTER — Encounter: Payer: Self-pay | Admitting: Internal Medicine

## 2022-07-27 VITALS — BP 122/84 | HR 68 | Temp 98.4°F | Resp 18 | Ht 65.5 in | Wt 221.1 lb

## 2022-07-27 DIAGNOSIS — L42 Pityriasis rosea: Secondary | ICD-10-CM | POA: Diagnosis not present

## 2022-07-27 DIAGNOSIS — L308 Other specified dermatitis: Secondary | ICD-10-CM | POA: Diagnosis not present

## 2022-07-27 MED ORDER — CLOBETASOL PROPIONATE 0.05 % EX OINT: 1.0000 | TOPICAL_OINTMENT | Freq: Two times a day (BID) | CUTANEOUS | 0 refills | Status: DC

## 2022-07-27 MED ORDER — ACYCLOVIR 400 MG PO TABS: 400.0000 mg | ORAL_TABLET | Freq: Every day | ORAL | 0 refills | Status: AC

## 2022-07-27 NOTE — Progress Notes (Signed)
New Patient Note  RE: [name] MRN: [id number] DOB: Oct 07, 1975 Date of Office Visit: 07/27/2022  Consult requested by: Darreld Mclean, MD Primary care provider: Darreld Mclean, MD  Chief Complaint: Allergic Reaction (Pt states that in sept. After drinking the "Naked smoothie shake" and within in a week she had a reaction that started as rash and hives like. 2 rounds of preds was taken, biopsy was taken. Pt states that it itch a lot.)  History of Present Illness: I had the pleasure of seeing [name] for initial evaluation at the [facility] [place] on 07/27/2022. She is a 46 y.o. female, who is referred here by Copland, Gay Filler, MD for the evaluation of possible food allergy.  History obtained from patient, chart review.  In October she developed hyperpigmented annular eczematous rash which started on her left breast and spread to her chest legs and arms.  Rash was very pruritic and started 1 week after drinking a smoothie with beets.  Initially started treating with Benadryl however seen by her primary care on 06/07/2022.  She was given 40 mg IM of Depo-Medrol and a 5-day prednisone course.  She was also started on Pepcid and Atarax.  She was seen again on 06/13/2022 and diagnosed with pityriasis rosea.  She was started on triamcinolone cream, without good response.  She was also started on doxycycline.  Punch biopsy obtained on 06/20/2022 with results as below.  Some pictures show eczematous rashes and some show more urticarial lesions.  No new medications prior to onset of symptoms.  Lesions healed they develop silver scaling.  Skin biopsy;  SLIGHT SPONGIOTIC DERMATITIS, SEE DESCRIPTION Microscopic Description There is a superficial perivascular lymphocytic infiltrate associated with slight spongiosis and delicate mounds of parakeratosis. These are the changes of a subtle spongiotic dermatitis. The differential diagnosis includes pityriasis rosea and  superficial gyrate erythema. Less likely is contact or nummular dermatitis or an id reaction. Following review of the hematoxylin and eosin sections, a PAS stain was obtained to exclude a fungal infection. The PAS stain is negative for fungal organisms. Multiple sections have been examined  Derm note: 06/20/22  " Patient seen today to follow-up on a rash and have a punch biopsy See office visit notes 10/3 and 10/9 Suspect this rash may be pityriasis rosea, but rash has been very itchy and bothersome to patient.  Negative HIV and syphilis. Varicella-zoster IgM is weakly positive, but the rash is not typical of chickenpox-the face is spared.  Do not strongly suspect this is chickenpox   Patient get some relief with use of Vistaril for itching.  She tried the topical steroid but it did not really help.  She got perhaps some minimal improvement with oral prednisone previously.  She is taking doxycycline and thinks it may have helped her some"  Assessment and Plan: [name] is a 46 y.o. female with: Pityriasis rosea  Other specified dermatitis - Plan: Chronic Urticaria, Sed Rate (ESR), Tryptase, CBC With Differential, CMP14+EGFR, Alpha-Gal Panel, Thyroid Panel With TSH, Allergen, Beet Plan: Patient Instructions   Dermatitis  -Based on skin biopsy and history I do not think this is an IgE mediated beet allergy , however we will get blood work to double check  -Some pictures show urticaria so we will check for underlying causes of chronic urticaria with blood work  -Given severity will treat for Pityriasis Rosea with oral antivirals   PLAN:  -Get labs today: Beet, CBC w diff, CMP, tryptase,  TSH, hive panel, alpha-gal panel, inflammatory markers - Star Clobetasol (high dose steroid) twice a day on affected skin   -Avoid face, groin and armpits  -Continue Cetaphil or Vaseline to keep skin moisturized  - Start Acyclovir 470m 5 times a day for 7 days   Follow up: 2 weeks   Thank you so much for  letting me partake in your care today.  Don't hesitate to reach out if you have any additional concerns!  ERoney Marion MD  [facility] [place] [place]        Meds ordered this encounter  Medications   acyclovir (ZOVIRAX) 400 MG tablet    Sig: Take 1 tablet (400 mg total) by mouth 5 (five) times daily for 7 days.    Dispense:  35 tablet    Refill:  0   clobetasol ointment (TEMOVATE) 0.05 %    Sig: Apply 1 Application topically 2 (two) times daily.    Dispense:  30 g    Refill:  0   Lab Orders         Chronic Urticaria         Sed Rate (ESR)         Tryptase         CBC With Differential         CMP14+EGFR         Alpha-Gal Panel         Thyroid Panel With TSH         Allergen, Beet      Other allergy screening: Asthma: no Rhino conjunctivitis: no Food allergy: no Medication allergy: no Hymenoptera allergy: no Urticaria: no Eczema:no History of recurrent infections suggestive of immunodeficency: no  Diagnostics: None done    Past Medical History: Patient Active Problem List   Diagnosis Date Noted   Controlled type 2 diabetes mellitus without complication, without long-term current use of insulin (HCC) 01/05/2022   Cough 09/14/2016   Heart murmur 09/02/2015   Benign essential HTN 03/23/2015   Habitual aborter, 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   No pertinent past medical history    Past Surgical History: Past Surgical History:  Procedure Laterality Date   CESAREAN SECTION     CESAREAN SECTION  04/17/2012   Procedure: CESAREAN SECTION;  Surgeon: CShelly Bombard MD;  [facility];  Service: Gynecology;  Laterality: N/A;    Medication List:  Current Outpatient Medications  Medication Sig Dispense Refill   acyclovir (ZOVIRAX) 400 MG tablet Take 1 tablet (400 mg total) by mouth 5 (five) times daily for 7 days. 35 tablet 0   amLODipine (NORVASC) 10 MG tablet Take 1 tablet (10 mg total) by mouth daily. 90 tablet 1   carvedilol (COREG) 12.5 MG tablet Take 1 tablet (12.5 mg total) by mouth 2 (two) times daily with a meal. 180 tablet 3   Cholecalciferol 100 MCG (4000 UT) TABS Take 4,000 Units by mouth daily.     clobetasol ointment (TEMOVATE) 01.96% Apply 1 Application topically 2 (two) times daily. 30 g 0   doxycycline (VIBRAMYCIN) 100 MG capsule Take 1 capsule (100 mg total) by mouth 2 (two) times daily. 20 capsule 0  famotidine (PEPCID) 40 MG tablet Take 1 tablet (40 mg total) by mouth daily. 100 tablet 3   fluticasone (FLONASE) 50 MCG/ACT nasal spray SHAKE LIQUID AND USE 2 SPRAYS IN EACH NOSTRIL DAILY 48 g 2   hydrOXYzine (VISTARIL) 25 MG capsule Take 1 capsule (25 mg total) by mouth every 8 (eight) hours as needed for itching. 60 capsule 1   meloxicam (MOBIC) 15 MG tablet TAKE 1 TABLET BY MOUTH DAILY AS NEEDED FOR KNEE PAIN 90 tablet 0   Multiple Vitamin (MULTIVITAMIN) tablet Take 1 tablet by mouth daily.     predniSONE (DELTASONE) 20 MG tablet Take 2 tablets po qd x 2 days, then 1 tablet per day x 5 days 9 tablet 0   triamcinolone cream (KENALOG) 0.1 % Apply 1 Application topically 2 (two) times daily. Use as needed on rash 453 g 0   zinc gluconate 50 MG tablet Take 50 mg by mouth daily.     No current facility-administered medications for this visit.   Allergies: Allergies  Allergen Reactions   Beet [Beta Vulgaris]    Amoxicillin Rash   Social History: Social History   Socioeconomic History   Marital status: Married    Spouse name: Not on file   Number of children: Not on file   Years of education: Not on file   Highest education level: Not on file  Occupational History   Not on file  Tobacco  Use   Smoking status: Never   Smokeless tobacco: Never  Vaping Use   Vaping Use: Never used  Substance and Sexual Activity   Alcohol use: No   Drug use: No   Sexual activity: Yes  Other Topics Concern   Not on file  Social History Narrative   Not on file   Social Determinants of Health   Financial Resource Strain: Not on file  Food Insecurity: Not on file  Transportation Needs: Not on file  Physical Activity: Not on file  Stress: Not on file  Social Connections: Not on file   Lives in a single family home.  No roaches in the house, and bed 2 feet off the floor.  No DM precautions on bed or pillows.  She is not exposed to fumes, chemicals or dust at work or through hobbies.  No HEPA filter in the home and home is not near an interstate or industrial area . Smoking: no exposure  Occupation: LPN  Environmental HistoryFreight forwarder in the house: no Charity fundraiser in the family room: yes Carpet in the bedroom: yes Heating: electric Cooling: central Pet: no  Family History: Family History  Problem Relation Age of Onset   Sickle cell anemia Brother    Hypertension Mother      ROS: All others negative except as noted per HPI.   Objective: BP 122/84   Pulse 68   Temp 98.4 F (36.9 C) (Temporal)   Resp 18   Ht 5' 5.5" (1.664 m)   Wt 221 lb 1.6 oz (100.3 kg)   SpO2 100%   BMI 36.23 kg/m  Body mass index is 36.23 kg/m.  General Appearance:  Alert, cooperative, no distress, appears stated age  Head:  Normocephalic, without obvious abnormality, atraumatic  Eyes:  Conjunctiva clear, EOM's intact  Nose: Nares normal,   Throat: Lips, tongue normal; teeth and gums normal,   Neck: Supple, symmetrical  Lungs:   , Respirations unlabored, no coughing  Heart:  regular rate and rhythm and no murmur, Appears well perfused  Extremities:  No edema  Skin: Skin color, texture, turgor normal,  hyperpigmented eczematous patches on legs, chest, arms, back (40% BSA)  Neurologic: No  gross deficits   The plan was reviewed with the patient/family, and all questions/concerned were addressed.  It was my pleasure to see [name] today and participate in her care. Please feel free to contact me with any questions or concerns.  Sincerely,  Roney Marion, MD Allergy & Immunology  [facility] [place] office: [phone] [facility] office: [phone]

## 2022-07-27 NOTE — Patient Instructions (Signed)
Dermatitis  -Based on skin biopsy and history I do not think this is an IgE mediated beet allergy , however we will get blood work to double check  -Some pictures show urticaria so we will check for underlying causes of chronic urticaria with blood work  -Given severity will treat for Pityriasis Rosea with oral antivirals   PLAN:  -Get labs today: Beet, CBC w diff, CMP, tryptase, TSH, hive panel, alpha-gal panel, inflammatory markers - Star Clobetasol (high dose steroid) twice a day on affected skin   -Avoid face, groin and armpits  -Continue Cetaphil or Vaseline to keep skin moisturized  - Start Acyclovir 400mg  5 times a day for 7 days   Follow up: 2 weeks   Thank you so much for letting me partake in your care today.  Don't hesitate to reach out if you have any additional concerns!  , MD  [facility] [place] [place]

## 2022-08-02 NOTE — Progress Notes (Signed)
Cardiology Office Note   Date:  08/03/2022   ID:  [name], DOB 25-Sep-1975, MRN [id number]  PCP:  Regina Cables, MD  Cardiologist:   Regina Pates, MD   F/U for follow up of HTN      History of Present Illness: [name] is a 46 y.o. female with a history of palpitations, atypical CP and HTN   Echo in 2018 showed LVEF normal   NOrmal valve function   I saw the pt in July 2023  I recomm increasing amlodipine to 5 mg bid and carvedilol to 12.5 bid at that time     Since seen she developed whole body rash   Extensive evaluation   Has ha referral to dermatology  IMproving     Deneis CP   Breathing is OK  No dizzienss     Current Meds  Medication Sig   acyclovir (ZOVIRAX) 400 MG tablet Take 1 tablet (400 mg total) by mouth 5 (five) times daily for 7 days.   amLODipine (NORVASC) 10 MG tablet Take 1 tablet (10 mg total) by mouth daily.   carvedilol (COREG) 12.5 MG tablet Take 1 tablet (12.5 mg total) by mouth 2 (two) times daily with a meal.   Cholecalciferol 100 MCG (4000 UT) TABS Take 4,000 Units by mouth daily.   clobetasol ointment (TEMOVATE) 0.05 % Apply 1 Application topically 2 (two) times daily.   famotidine (PEPCID) 40 MG tablet Take 1 tablet (40 mg total) by mouth daily.   fluticasone (FLONASE) 50 MCG/ACT nasal spray SHAKE LIQUID AND USE 2 SPRAYS IN EACH NOSTRIL DAILY   hydrOXYzine (VISTARIL) 25 MG capsule Take 1 capsule (25 mg total) by mouth every 8 (eight) hours as needed for itching.   meloxicam (MOBIC) 15 MG tablet TAKE 1 TABLET BY MOUTH DAILY AS NEEDED FOR KNEE PAIN   Multiple Vitamin (MULTIVITAMIN) tablet Take 1 tablet by mouth daily.   triamcinolone cream (KENALOG) 0.1 % Apply 1 Application topically 2 (two) times daily. Use as needed on rash   zinc gluconate 50 MG tablet Take 50 mg by mouth daily.     Allergies:   Beet [beta vulgaris] and Amoxicillin   Past Medical History:  Diagnosis Date   Beta thalassemia trait    Essential  hypertension, benign 06/12/2012   [facility] abstract note also started on two  new meds for this see med list.   No pertinent past medical history     Past Surgical History:  Procedure Laterality Date   CESAREAN SECTION     CESAREAN SECTION  04/17/2012   Procedure: CESAREAN SECTION;  Surgeon: Regina Bad, MD;  Location: [facility];  Service: Gynecology;  Laterality: N/A;     Social History:  The patient  reports that she has never smoked. She has never used smokeless tobacco. She reports that she does not drink alcohol and does not use drugs.   Family History:  The patient's family history includes Hypertension in her mother; Sickle cell anemia in her brother.    ROS:  Please see the history of present illness. All other systems are reviewed and  Negative to the above problem except as noted.    PHYSICAL EXAM: VS:  BP 122/72   Pulse 70   Ht 5\' 5"  (1.651 m)   Wt 221 lb 3.2 oz (100.3 kg)   SpO2 98%   BMI 36.81 kg/m    GEN: Obese 46 yo in no acute distress  HEENT:  normal  Neck: JVP is normal  No  carotid bruits Cardiac: RRR;  No murmurs  No lower extrem edema  Respiratory:  clear to auscultation bilaterally,  Chest:  Nontender GI: soft, nontender, nondistended, + BS  No hepatomegaly  MS: no deformity Moving all extremities   Skin: warm and dry  Diffuse macular rash limbs     Neuro:  Strength and sensation are intact Psych: euthymic mood, full affect   EKG:  EKG is not ordered today   Lipid Panel    Component Value Date/Time   CHOL 175 05/11/2022 0958   CHOL 182 08/21/2020 1003   TRIG 68.0 05/11/2022 0958   HDL 53.10 05/11/2022 0958   HDL 59 08/21/2020 1003   CHOLHDL 3 05/11/2022 0958   VLDL 13.6 05/11/2022 0958   LDLCALC 108 (H) 05/11/2022 0958   LDLCALC 109 (H) 08/21/2020 1003      Wt Readings from Last 3 Encounters:  08/03/22 221 lb 3.2 oz (100.3 kg)  07/27/22 221 lb 1.6 oz (100.3 kg)  06/20/22 226 lb (102.5 kg)      ASSESSMENT AND  PLAN:  1  HTN   BP is much better   120s at home    Keep on same regimen     2   CP   Denies     3  Palpitations   Denies      4  HCM  LDL 102   HDL 51      5  Obesity  Will cotninue to review diet   6  Rash   Wil put in for derm evaluation     Current medicines are reviewed at length with the patient today.  The patient does not have concerns regarding medicines.  Signed, Regina Pates, MD  08/03/2022 10:24 AM    [facility] [address] [place], [place], [place]  [address] Phone: [phone]; Fax: [phone]

## 2022-08-03 ENCOUNTER — Encounter: Payer: Self-pay | Admitting: Internal Medicine

## 2022-08-03 ENCOUNTER — Ambulatory Visit: Payer: PRIVATE HEALTH INSURANCE | Attending: Internal Medicine | Admitting: Internal Medicine

## 2022-08-03 VITALS — BP 122/72 | HR 70 | Ht 65.0 in | Wt 221.2 lb

## 2022-08-03 DIAGNOSIS — R21 Rash and other nonspecific skin eruption: Secondary | ICD-10-CM | POA: Diagnosis not present

## 2022-08-03 LAB — ALPHA-GAL PANEL
Allergen Lamb IgE: <0.1 kU/L
Beef IgE: <0.1 kU/L
IgE (Immunoglobulin E), Serum: 10 IU/mL (ref 6–495)
O215-IgE Alpha-Gal: <0.1 kU/L
Pork IgE: <0.1 kU/L

## 2022-08-03 LAB — CBC WITH DIFFERENTIAL
Basophils Absolute: 0 10*3/uL (ref 0.0–0.2)
Basos: 0 %
EOS (ABSOLUTE): 0.1 10*3/uL (ref 0.0–0.4)
Eos: 2 %
Hematocrit: 33.4 % — ABNORMAL LOW (ref 34.0–46.6)
Hemoglobin: 9.5 g/dL — ABNORMAL LOW (ref 11.1–15.9)
Immature Grans (Abs): 0 10*3/uL (ref 0.0–0.1)
Immature Granulocytes: 0 %
Lymphocytes Absolute: 2.1 10*3/uL (ref 0.7–3.1)
Lymphs: 38 %
MCH: 19 pg — ABNORMAL LOW (ref 26.6–33.0)
MCHC: 28.4 g/dL — ABNORMAL LOW (ref 31.5–35.7)
MCV: 67 fL — ABNORMAL LOW (ref 79–97)
Monocytes Absolute: 0.4 10*3/uL (ref 0.1–0.9)
Monocytes: 8 %
Neutrophils Absolute: 2.8 10*3/uL (ref 1.4–7.0)
Neutrophils: 52 %
RBC: 5.01 x10E6/uL (ref 3.77–5.28)
RDW: 20 % — ABNORMAL HIGH (ref 11.7–15.4)
WBC: 5.4 10*3/uL (ref 3.4–10.8)

## 2022-08-03 LAB — THYROID PANEL WITH TSH
Free Thyroxine Index: 2.5 (ref 1.2–4.9)
T3 Uptake Ratio: 26 % (ref 24–39)
T4, Total: 9.7 ug/dL (ref 4.5–12.0)
TSH: 0.334 u[IU]/mL — ABNORMAL LOW (ref 0.450–4.500)

## 2022-08-03 LAB — CMP14+EGFR
ALT: 10 IU/L (ref 0–32)
AST: 15 IU/L (ref 0–40)
Albumin/Globulin Ratio: 1.4 (ref 1.2–2.2)
Albumin: 4.5 g/dL (ref 3.9–4.9)
Alkaline Phosphatase: 66 IU/L (ref 44–121)
BUN/Creatinine Ratio: 13 (ref 9–23)
BUN: 10 mg/dL (ref 6–24)
Bilirubin Total: 0.4 mg/dL (ref 0.0–1.2)
CO2: 22 mmol/L (ref 20–29)
Calcium: 9.7 mg/dL (ref 8.7–10.2)
Chloride: 101 mmol/L (ref 96–106)
Creatinine, Ser: 0.78 mg/dL (ref 0.57–1.00)
Globulin, Total: 3.2 g/dL (ref 1.5–4.5)
Glucose: 92 mg/dL (ref 70–99)
Potassium: 3.7 mmol/L (ref 3.5–5.2)
Sodium: 136 mmol/L (ref 134–144)
Total Protein: 7.7 g/dL (ref 6.0–8.5)
eGFR: 94 mL/min/{1.73_m2} (ref >59)

## 2022-08-03 LAB — TRYPTASE: Tryptase: 6.8 ug/L (ref 2.2–13.2)

## 2022-08-03 LAB — SEDIMENTATION RATE: Sed Rate: 102 mm/hr — ABNORMAL HIGH (ref 0–32)

## 2022-08-03 LAB — ALLERGEN, BEET: Red Beet: <0.1 kU/L

## 2022-08-03 LAB — CHRONIC URTICARIA: cu index: 22.8 — ABNORMAL HIGH (ref <10)

## 2022-08-03 NOTE — Patient Instructions (Signed)
Medication Instructions:   *If you need a refill on your cardiac medications before your next appointment, please call your pharmacy*   Lab Work:  If you have labs (blood work) drawn today and your tests are completely normal, you will receive your results only by: MyChart Message (if you have MyChart) OR A paper copy in the mail If you have any lab test that is abnormal or we need to change your treatment, we will call you to review the results.   Testing/Procedures:    Follow-Up: At [facility], you and your health needs are our priority.  As part of our continuing mission to provide you with exceptional heart care, we have created designated Provider Care Teams.  These Care Teams include your primary Cardiologist (physician) and Advanced Practice Providers (APPs -  Physician Assistants and Nurse Practitioners) who all work together to provide you with the care you need, when you need it.  We recommend signing up for the patient portal called "MyChart".  Sign up information is provided on this After Visit Summary.  MyChart is used to connect with patients for Virtual Visits (Telemedicine).  Patients are able to view lab/test results, encounter notes, upcoming appointments, etc.  Non-urgent messages can be sent to your provider as well.   To learn more about what you can do with MyChart, go to [email or web address].    Your next appointment:   8 month(s)  The format for your next appointment:   In Person  Provider:   Dr Dietrich Pates  Referral to [facility]    Important Information About Sugar

## 2022-08-12 ENCOUNTER — Encounter: Payer: Self-pay | Admitting: Internal Medicine

## 2022-08-12 ENCOUNTER — Ambulatory Visit (INDEPENDENT_AMBULATORY_CARE_PROVIDER_SITE_OTHER): Payer: PRIVATE HEALTH INSURANCE | Admitting: Internal Medicine

## 2022-08-12 VITALS — BP 122/78 | HR 89 | Temp 98.9°F | Resp 16

## 2022-08-12 DIAGNOSIS — L309 Dermatitis, unspecified: Secondary | ICD-10-CM

## 2022-08-12 NOTE — Progress Notes (Signed)
Follow Up Note  RE: [name] MRN: [id number] DOB: [birth year] Date of Office Visit: 08/12/2022  Referring provider: Darreld Mclean, MD Primary care provider: Darreld Mclean, MD  Chief Complaint: Rash  History of Present Illness: I had the pleasure of seeing [name] for a follow up visit at the [facility] on 08/12/2022. She is a 46 y.o. female, who is being followed for pityriasis rosea versus other dermatitis. Her previous allergy office visit was on 07/27/2022 with Dr. Edison Pace. Today is a regular follow up visit.  History obtained from patient, chart review .  At last visit she was started with acyclovir 400 mg 5 times a day for 7 days for severe pityriasis rosea.  She was also started on clobetasol.  Today she reports some improvement in acute patches.  She does have hyperpigmentation that has persisted. Blood work was obtained it was negative to eat.  Her ESR was 102 and chronic urticaria index elevated at 22. Thyroid and blood counts were consistent with prior and previous diagnoses of beta thalassemia trait.  Tryptase and alpha gal negative.  Today she denies any fevers, red hot swollen joints, cough, recurrent sinus infections, weight loss.  Clobetasol has helped although she does feel like her skin is very dry and she is having to moisturize multiple times a day.  She did feel like acyclovir helped and symptoms have not rebounded since finishing this medication  Previous Work up:  Skin biopsy;  SLIGHT SPONGIOTIC DERMATITIS, SEE DESCRIPTION Microscopic Description There is a superficial perivascular lymphocytic infiltrate associated with slight spongiosis and delicate mounds of parakeratosis. These are the changes of a subtle spongiotic dermatitis. The differential diagnosis includes pityriasis rosea and superficial gyrate erythema. Less likely is contact or nummular dermatitis or an id reaction. Following review of the hematoxylin  and eosin sections, a PAS stain was obtained to exclude a fungal infection. The PAS stain is negative for fungal organisms. Multiple sections have been examined   PCP note: 06/20/22  " Patient seen today to follow-up on a rash and have a punch biopsy See office visit notes 10/3 and 10/9 Suspect this rash may be pityriasis rosea, but rash has been very itchy and bothersome to patient.  Negative HIV and syphilis. Varicella-zoster IgM is weakly positive, but the rash is not typical of chickenpox-the face is spared.  Do not strongly suspect this is chickenpox   Patient get some relief with use of Vistaril for itching.  She tried the topical steroid but it did not really help.  She got perhaps some minimal improvement with oral prednisone previously.  She is taking doxycycline and thinks it may have helped her some"  Assessment and Plan: [name] is a 46 y.o. female with: Dermatitis - Plan: ANCA Profile, ANA Direct w/Reflex if Positive, Sed Rate (ESR), C-reactive protein  I suspect media rosea responded to acyclovir and current persistent hyperpigmentation is postinflammatory.  Counseled this may take months to really resolve.  Labs are concerning for very elevated ESR.  Will repeat labs today and get screening labs for autoimmune processes.  If his ESR remains elevated will refer to rheumatology for vasculitis workup.  No symptoms concerning for infectious endocarditis which is also can be associated with an ESR of greater than 100.  She does have a dermatology visit pending in February.  Prior biopsy was obtained by primary care and perhaps dedicated dermatology evaluation will help elucidate current symptoms if labs continue to remain positive.  Plan: Patient Instructions   Dermatitis  - Skin biopsy with pityriasis versicolor - We have treated this infection with the acyclovir  -Overall skin looks better  - However inflammatory makers were very high and we need to investigate this   PLAN:  -Get  labs today: CRP, ESR, ANA, ANCA - Continue Clobetasol (high dose steroid) twice a day on affected skin   -Avoid face, groin and armpits  -Continue Cetaphil or Vaseline to keep skin moisturized  - Post inflammatory pigment changes can take months to resolve    Follow up: we will call you with lab results and next steps    Thank you so much for letting me partake in your care today.  Don't hesitate to reach out if you have any additional concerns!  Roney Marion, MD  [facility] [place] [place]      No follow-ups on file.  No orders of the defined types were placed in this encounter.   Lab Orders         ANCA Profile         ANA Direct w/Reflex if Positive         Sed Rate (ESR)         C-reactive protein     Diagnostics: None done    Medication List:  Current Outpatient Medications  Medication Sig Dispense Refill   amLODipine (NORVASC) 10 MG tablet Take 1 tablet (10 mg total) by mouth daily. 90 tablet 1   carvedilol (COREG) 12.5 MG tablet Take 1 tablet (12.5 mg total) by mouth 2 (two) times daily with a meal. 180 tablet 3   Cholecalciferol 100 MCG (4000 UT) TABS Take 4,000 Units by mouth daily.     clobetasol ointment (TEMOVATE) 4.09 % Apply 1 Application topically 2 (two) times daily. 30 g 0   famotidine (PEPCID) 40 MG tablet Take 1 tablet (40 mg total) by mouth daily. 100 tablet 3   fluticasone (FLONASE) 50 MCG/ACT nasal spray SHAKE LIQUID AND USE 2 SPRAYS IN EACH NOSTRIL DAILY 48 g 2   hydrOXYzine (VISTARIL) 25 MG capsule Take 1 capsule (25 mg total) by mouth every 8 (eight) hours as needed for itching. 60 capsule 1   meloxicam (MOBIC) 15 MG tablet TAKE 1 TABLET BY MOUTH DAILY AS NEEDED FOR KNEE PAIN 90 tablet 0   Multiple Vitamin (MULTIVITAMIN) tablet Take 1 tablet by mouth daily.     triamcinolone cream (KENALOG) 0.1 % Apply 1 Application topically 2 (two) times daily. Use as needed on rash 453 g 0   zinc gluconate 50 MG tablet Take 50 mg by mouth  daily.     No current facility-administered medications for this visit.   Allergies: Allergies  Allergen Reactions   Beet [Beta Vulgaris]    Amoxicillin Rash   I reviewed her past medical history, social history, family history, and environmental history and no significant changes have been reported from her previous visit.  ROS: All others negative except as noted per HPI.   Objective: BP 122/78   Pulse 89   Temp 98.9 F (37.2 C) (Temporal)   Resp 16   SpO2 100%  There is no height or weight on file to calculate BMI. General Appearance:  Alert, cooperative, no distress, appears stated age  Head:  Normocephalic, without obvious abnormality, atraumatic  Eyes:  Conjunctiva clear, EOM's intact  Nose: Nares normal,   Throat: Lips, tongue normal; teeth and gums normal,   Neck: Supple, symmetrical  Lungs:  clear to auscultation bilaterally, Respirations unlabored, no coughing  Heart:  regular rate and rhythm and no murmur, Appears well perfused  Extremities: No edema  Skin: Skin color, texture, turgor normal, "hyperpigmented flat smooth patches  Neurologic: No gross deficits   Previous notes and tests were reviewed. The plan was reviewed with the patient/family, and all questions/concerned were addressed.  It was my pleasure to see [name] today and participate in her care. Please feel free to contact me with any questions or concerns.  Sincerely,  Roney Marion, MD  Allergy & Immunology  [facility] Office: [phone]

## 2022-08-12 NOTE — Patient Instructions (Addendum)
Dermatitis  - Skin biopsy with pityriasis versicolor - We have treated this infection with the acyclovir  -Overall skin looks better  - However inflammatory makers were very high and we need to investigate this   PLAN:  -Get labs today: CRP, ESR, ANA, ANCA - Continue Clobetasol (high dose steroid) twice a day on affected skin   -Avoid face, groin and armpits  -Continue Cetaphil or Vaseline to keep skin moisturized  - Post inflammatory pigment changes can take months to resolve    Follow up: we will call you with lab results and next steps    Thank you so much for letting me partake in your care today.  Don't hesitate to reach out if you have any additional concerns!  Roney Marion, MD  [facility] [place], [place]

## 2022-08-15 ENCOUNTER — Other Ambulatory Visit: Payer: Self-pay | Admitting: Family Medicine

## 2022-08-15 DIAGNOSIS — L42 Pityriasis rosea: Secondary | ICD-10-CM

## 2022-08-16 LAB — C-REACTIVE PROTEIN: CRP: 4 mg/L (ref 0–10)

## 2022-08-16 LAB — ANCA PROFILE
Anti-MPO Antibodies: <0.2 units (ref 0.0–0.9)
Anti-PR3 Antibodies: <0.2 units (ref 0.0–0.9)
Atypical pANCA: <1:20 {titer}
C-ANCA: <1:20 {titer}
P-ANCA: <1:20 {titer}

## 2022-08-16 LAB — SEDIMENTATION RATE: Sed Rate: 60 mm/hr — ABNORMAL HIGH (ref 0–32)

## 2022-08-16 LAB — ANA W/REFLEX IF POSITIVE: Anti Nuclear Antibody (ANA): NEGATIVE

## 2022-08-16 NOTE — Progress Notes (Signed)
Overall blood work is reassuring.  ESR is decreased by almost 90%.  I like to keep track your inflammatory markers to make sure were not missing anything.  I do not think there is a reason for further workup at this time.  If anything looks abnormal with our tracking then will have a quick referral to rheumatology.  I would like her to follow-up with me in 4 weeks.  We will repeat labs at that time.  If things get worse please have her follow-up sooner than that.

## 2022-10-25 NOTE — Progress Notes (Unsigned)
[place] [facility] [address] [place], [place] [address] [phone]  Date:  10/31/2022   Name:  [name]   DOB:  [birth year]   MRN:  [id number]  PCP:  Darreld Mclean, MD    Chief Complaint: school forms, update vaccines (Concerns/ questions: 1. she asks if she can have her labs done today while she is here. 2. Leg cramps)   History of Present Illness:  [name] is a 47 y.o. 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  She also needs an MMR titer.  She does not think she has yet been immunized against hepatitis B, we will start the series today.  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   No pertinent past medical history     Past  Surgical History:  Procedure Laterality Date   CESAREAN SECTION     CESAREAN SECTION  04/17/2012   Procedure: CESAREAN SECTION;  Surgeon: Shelly Bombard, MD;  Location: [facility];  Service: Gynecology;  Laterality: N/A;    Social History   Tobacco Use   Smoking status: Never   Smokeless tobacco: Never  Vaping Use   Vaping Use: Never used  Substance Use Topics   Alcohol use: No   Drug use: No    Family History  Problem Relation Age of Onset   Sickle cell anemia Brother    Hypertension Mother     Allergies  Allergen Reactions   Beet [Beta Vulgaris]    Amoxicillin Rash    Medication list has been reviewed and updated.  Current Outpatient Medications on File Prior to Visit  Medication Sig Dispense Refill   amLODipine (NORVASC) 10 MG tablet Take 1 tablet (10 mg total) by mouth daily. 90 tablet 1   carvedilol (COREG) 12.5 MG tablet Take 1 tablet (12.5 mg total) by mouth 2 (two) times daily with a meal. 180 tablet 3   Cholecalciferol 100 MCG (4000 UT) TABS Take 4,000 Units by mouth daily.     famotidine (PEPCID) 40 MG tablet Take 1 tablet (40 mg total) by mouth daily. 100 tablet 3   fluticasone (FLONASE) 50 MCG/ACT nasal spray SHAKE LIQUID AND USE 2 SPRAYS  IN EACH NOSTRIL DAILY 48 g 2   hydrOXYzine (VISTARIL) 25 MG capsule Take 1 capsule (25 mg total) by mouth every 8 (eight) hours as needed for itching. 60 capsule 1   meloxicam (MOBIC) 15 MG tablet TAKE 1 TABLET BY MOUTH DAILY AS NEEDED FOR KNEE PAIN 90 tablet 0   Multiple Vitamin (MULTIVITAMIN) tablet Take 1 tablet by mouth daily.     zinc gluconate 50 MG tablet Take 50 mg by mouth daily.     No current facility-administered medications on file prior to visit.    Review of Systems:  As per HPI- otherwise negative.   Physical Examination: Vitals:   10/31/22 1020  BP: 132/80  Pulse: 78  Resp: 18  Temp: 97.8 F (36.6 C)  SpO2: 99%   Vitals:   10/31/22 1020  Weight: 218 lb 6.4 oz (99.1 kg)  Height: '5\' 5"'$  (1.651  m)   Body mass index is 36.34 kg/m. Ideal Body Weight: Weight in (lb) to have BMI = 25: 149.9  GEN: no acute distress.  Obese, looks well HEENT: Atraumatic, Normocephalic.  Ears and Nose: No external deformity. CV: RRR, No M/G/R. No JVD. No thrill. No extra heart sounds. PULM: CTA B, no wheezes, crackles, rhonchi. No retractions. No resp. distress. No accessory muscle use. ABD: S, NT, ND, +BS. No rebound. No HSM. EXTR: No c/c/e PSYCH: Normally interactive. Conversant.  Calves are soft and nontender  Assessment and Plan: Leg cramps - Plan: TSH, Ferritin, B12, Folate  Benign essential HTN - Plan: CBC  Beta thalassemia trait  Controlled type 2 diabetes mellitus without complication, without long-term current use of insulin (HCC) - Plan: Hemoglobin A1c, Comprehensive metabolic panel  Screening examination for pulmonary tuberculosis - Plan: QuantiFERON-TB Gold Plus  Exposure to body fluid - Plan: Measles/Mumps/Rubella Immunity, Heplisav-B (HepB-CPG) Vaccine, CANCELED: Hepatitis B surface antibody,quantitative Patient seen today for follow-up.  History of well-controlled diabetes, A1c pending Blood pressure under good control She needs immunization update/titers for an RN program she will start in August.  Gave her first dose of hepatitis B vaccine today, second dose due in 4 + weeks Titers, QuantiFERON pending as above  Signed Lamar Blinks, MD  Received labs as below, message to patient  Results for orders placed or performed in visit on 10/31/22  Hemoglobin A1c  Result Value Ref Range   Hgb A1c MFr Bld 6.3 4.6 - 6.5 %  Comprehensive metabolic panel  Result Value Ref Range   Sodium 138 135 - 145 mEq/L   Potassium 3.6 3.5 - 5.1 mEq/L   Chloride 104 96 - 112 mEq/L   CO2 27 19 - 32 mEq/L   Glucose, Bld 92 70 - 99 mg/dL   BUN 11 6 - 23 mg/dL   Creatinine, Ser 0.63 0.40 - 1.20 mg/dL   Total Bilirubin 0.3 0.2 - 1.2 mg/dL   Alkaline Phosphatase 53 39 - 117 U/L   AST 13 0 -  37 U/L   ALT 7 0 - 35 U/L   Total Protein 6.7 6.0 - 8.3 g/dL   Albumin 3.7 3.5 - 5.2 g/dL   GFR 104.57 >60.00 mL/min   Calcium 9.0 8.4 - 10.5 mg/dL  CBC  Result Value Ref Range   WBC 5.7 4.0 - 10.5 K/uL   RBC 4.43 3.87 - 5.11 Mil/uL   Platelets 237.0 150.0 - 400.0 K/uL   Hemoglobin 8.4 Repeated and verified X2. (L) 12.0 - 15.0 g/dL   HCT 27.7 (L) 36.0 - 46.0 %  MCV 62.7 Repeated and verified X2. (L) 78.0 - 100.0 fl   MCHC 30.1 30.0 - 36.0 g/dL   RDW 22.9 (H) 11.5 - 15.5 %  TSH  Result Value Ref Range   TSH 0.45 0.35 - 5.50 uIU/mL  Ferritin  Result Value Ref Range   Ferritin 5.3 (L) 10.0 - 291.0 ng/mL  B12  Result Value Ref Range   Vitamin B-12 771 211 - 911 pg/mL  Folate  Result Value Ref Range   Folate 15.6 >5.9 ng/mL

## 2022-10-25 NOTE — Patient Instructions (Addendum)
Good to see you again today  Recommend covid booster if not up to date  I will get the needed titers and complete your paperwork for you, then you can pick it up Will be in touch with your labs asap

## 2022-10-31 ENCOUNTER — Ambulatory Visit: Payer: PRIVATE HEALTH INSURANCE | Admitting: Family Medicine

## 2022-10-31 ENCOUNTER — Encounter: Payer: Self-pay | Admitting: Family Medicine

## 2022-10-31 VITALS — BP 132/80 | HR 78 | Temp 97.8°F | Resp 18 | Ht 65.0 in | Wt 218.4 lb

## 2022-10-31 DIAGNOSIS — D563 Thalassemia minor: Secondary | ICD-10-CM | POA: Diagnosis not present

## 2022-10-31 DIAGNOSIS — D500 Iron deficiency anemia secondary to blood loss (chronic): Secondary | ICD-10-CM

## 2022-10-31 DIAGNOSIS — Z7721 Contact with and (suspected) exposure to potentially hazardous body fluids: Secondary | ICD-10-CM

## 2022-10-31 DIAGNOSIS — Z23 Encounter for immunization: Secondary | ICD-10-CM

## 2022-10-31 DIAGNOSIS — R252 Cramp and spasm: Secondary | ICD-10-CM

## 2022-10-31 DIAGNOSIS — I10 Essential (primary) hypertension: Secondary | ICD-10-CM

## 2022-10-31 DIAGNOSIS — Z111 Encounter for screening for respiratory tuberculosis: Secondary | ICD-10-CM

## 2022-10-31 DIAGNOSIS — D509 Iron deficiency anemia, unspecified: Secondary | ICD-10-CM

## 2022-10-31 DIAGNOSIS — E119 Type 2 diabetes mellitus without complications: Secondary | ICD-10-CM

## 2022-10-31 LAB — COMPREHENSIVE METABOLIC PANEL
ALT: 7 U/L (ref 0–35)
AST: 13 U/L (ref 0–37)
Albumin: 3.7 g/dL (ref 3.5–5.2)
Alkaline Phosphatase: 53 U/L (ref 39–117)
BUN: 11 mg/dL (ref 6–23)
CO2: 27 mEq/L (ref 19–32)
Calcium: 9 mg/dL (ref 8.4–10.5)
Chloride: 104 mEq/L (ref 96–112)
Creatinine, Ser: 0.63 mg/dL (ref 0.40–1.20)
GFR: 104.57 mL/min (ref >60.00)
Glucose, Bld: 92 mg/dL (ref 70–99)
Potassium: 3.6 mEq/L (ref 3.5–5.1)
Sodium: 138 mEq/L (ref 135–145)
Total Bilirubin: 0.3 mg/dL (ref 0.2–1.2)
Total Protein: 6.7 g/dL (ref 6.0–8.3)

## 2022-10-31 LAB — HEMOGLOBIN A1C: Hgb A1c MFr Bld: 6.3 % (ref 4.6–6.5)

## 2022-10-31 LAB — CBC
HCT: 27.7 % — ABNORMAL LOW (ref 36.0–46.0)
Hemoglobin: 8.4 g/dL — ABNORMAL LOW (ref 12.0–15.0)
MCHC: 30.1 g/dL (ref 30.0–36.0)
MCV: 62.7 fl — ABNORMAL LOW (ref 78.0–100.0)
Platelets: 237 10*3/uL (ref 150.0–400.0)
RBC: 4.43 Mil/uL (ref 3.87–5.11)
RDW: 22.9 % — ABNORMAL HIGH (ref 11.5–15.5)
WBC: 5.7 10*3/uL (ref 4.0–10.5)

## 2022-10-31 LAB — TSH: TSH: 0.45 u[IU]/mL (ref 0.35–5.50)

## 2022-10-31 LAB — VITAMIN B12: Vitamin B-12: 771 pg/mL (ref 211–911)

## 2022-10-31 LAB — FOLATE: Folate: 15.6 ng/mL (ref >5.9)

## 2022-10-31 LAB — FERRITIN: Ferritin: 5.3 ng/mL — ABNORMAL LOW (ref 10.0–291.0)

## 2022-11-02 ENCOUNTER — Inpatient Hospital Stay: Payer: PRIVATE HEALTH INSURANCE | Attending: Hematology & Oncology

## 2022-11-02 ENCOUNTER — Inpatient Hospital Stay (HOSPITAL_BASED_OUTPATIENT_CLINIC_OR_DEPARTMENT_OTHER): Payer: PRIVATE HEALTH INSURANCE | Admitting: Family

## 2022-11-02 ENCOUNTER — Telehealth: Payer: Self-pay | Admitting: *Deleted

## 2022-11-02 ENCOUNTER — Encounter: Payer: Self-pay | Admitting: Family Medicine

## 2022-11-02 ENCOUNTER — Encounter: Payer: Self-pay | Admitting: Family

## 2022-11-02 ENCOUNTER — Other Ambulatory Visit: Payer: Self-pay | Admitting: Family

## 2022-11-02 VITALS — BP 144/95 | HR 89 | Temp 98.2°F | Resp 17 | Ht 65.5 in | Wt 216.0 lb

## 2022-11-02 DIAGNOSIS — R21 Rash and other nonspecific skin eruption: Secondary | ICD-10-CM | POA: Insufficient documentation

## 2022-11-02 DIAGNOSIS — N920 Excessive and frequent menstruation with regular cycle: Secondary | ICD-10-CM

## 2022-11-02 DIAGNOSIS — D649 Anemia, unspecified: Secondary | ICD-10-CM

## 2022-11-02 DIAGNOSIS — I10 Essential (primary) hypertension: Secondary | ICD-10-CM | POA: Diagnosis not present

## 2022-11-02 DIAGNOSIS — R7303 Prediabetes: Secondary | ICD-10-CM | POA: Insufficient documentation

## 2022-11-02 DIAGNOSIS — R42 Dizziness and giddiness: Secondary | ICD-10-CM | POA: Insufficient documentation

## 2022-11-02 DIAGNOSIS — D563 Thalassemia minor: Secondary | ICD-10-CM

## 2022-11-02 DIAGNOSIS — D509 Iron deficiency anemia, unspecified: Secondary | ICD-10-CM | POA: Insufficient documentation

## 2022-11-02 DIAGNOSIS — D500 Iron deficiency anemia secondary to blood loss (chronic): Secondary | ICD-10-CM | POA: Diagnosis not present

## 2022-11-02 DIAGNOSIS — K5900 Constipation, unspecified: Secondary | ICD-10-CM | POA: Insufficient documentation

## 2022-11-02 DIAGNOSIS — R5383 Other fatigue: Secondary | ICD-10-CM | POA: Insufficient documentation

## 2022-11-02 DIAGNOSIS — R002 Palpitations: Secondary | ICD-10-CM | POA: Diagnosis not present

## 2022-11-02 LAB — CBC WITH DIFFERENTIAL (CANCER CENTER ONLY)
Abs Immature Granulocytes: 0.03 10*3/uL (ref 0.00–0.07)
Basophils Absolute: 0 10*3/uL (ref 0.0–0.1)
Basophils Relative: 0 %
Eosinophils Absolute: 0.2 10*3/uL (ref 0.0–0.5)
Eosinophils Relative: 3 %
HCT: 27.2 % — ABNORMAL LOW (ref 36.0–46.0)
Hemoglobin: 7.9 g/dL — ABNORMAL LOW (ref 12.0–15.0)
Immature Granulocytes: 1 %
Lymphocytes Relative: 46 %
Lymphs Abs: 2.3 10*3/uL (ref 0.7–4.0)
MCH: 18.6 pg — ABNORMAL LOW (ref 26.0–34.0)
MCHC: 29 g/dL — ABNORMAL LOW (ref 30.0–36.0)
MCV: 64.2 fL — ABNORMAL LOW (ref 80.0–100.0)
Monocytes Absolute: 0.6 10*3/uL (ref 0.1–1.0)
Monocytes Relative: 12 %
Neutro Abs: 1.9 10*3/uL (ref 1.7–7.7)
Neutrophils Relative %: 38 %
Platelet Count: 240 10*3/uL (ref 150–400)
RBC: 4.24 MIL/uL (ref 3.87–5.11)
RDW: 22.9 % — ABNORMAL HIGH (ref 11.5–15.5)
Smear Review: NORMAL
WBC Count: 5 10*3/uL (ref 4.0–10.5)
nRBC: 0 % (ref 0.0–0.2)

## 2022-11-02 LAB — QUANTIFERON-TB GOLD PLUS
Mitogen-NIL: >10 IU/mL
NIL: 0.09 IU/mL
QuantiFERON-TB Gold Plus: NEGATIVE
TB1-NIL: 0 IU/mL
TB2-NIL: 0 IU/mL

## 2022-11-02 LAB — MEASLES/MUMPS/RUBELLA IMMUNITY
Mumps IgG: 25.8 AU/mL
Rubella: 29.6 Index
Rubeola IgG: >300 AU/mL

## 2022-11-02 LAB — IRON AND IRON BINDING CAPACITY (CC-WL,HP ONLY)
Iron: 20 ug/dL — ABNORMAL LOW (ref 28–170)
Saturation Ratios: 5 % — ABNORMAL LOW (ref 10.4–31.8)
TIBC: 437 ug/dL (ref 250–450)
UIBC: 417 ug/dL (ref 148–442)

## 2022-11-02 LAB — RETICULOCYTES
Immature Retic Fract: 35.4 % — ABNORMAL HIGH (ref 2.3–15.9)
RBC.: 4.26 MIL/uL (ref 3.87–5.11)
Retic Count, Absolute: 85.2 10*3/uL (ref 19.0–186.0)
Retic Ct Pct: 2 % (ref 0.4–3.1)

## 2022-11-02 LAB — LACTATE DEHYDROGENASE: LDH: 156 U/L (ref 98–192)

## 2022-11-02 MED ORDER — FOLIC ACID 1 MG PO TABS: 1.0000 mg | ORAL_TABLET | Freq: Every day | ORAL | 11 refills | Status: DC

## 2022-11-02 NOTE — Telephone Encounter (Signed)
Per 11/02/22 [place] - called patient and gave upcoming appointments - requested call back to confirm and to schedule (3) doses of IV Iron.

## 2022-11-02 NOTE — Progress Notes (Signed)
Hematology/Oncology Consultation   Name: [name]      MRN: [id number]    Location: Room/bed info not found  Date: 11/02/2022 Time:10:40 AM   REFERRING PHYSICIAN:  Lamar Blinks, MD  REASON FOR CONSULT:  Iron deficiency anemia, unspecified    DIAGNOSIS:  Iron deficiency anemia  Beta Thalassemia trait  HISTORY OF PRESENT ILLNESS: [name] is a very pleasant 47 yo African American female with history of both iron deficiency anemia as well as the beta thalassemia trait.  She has never received IV iron or taken folic acid.  She states that her cycle is regular with extremely heavy flow and lots of clots. No other blood loss noted.  No abnormal bruising, no petechiae.  She is symptomatic with fatigue, dizziness, palpitations and craving ice.   Her brother passed away from complications of sickle cell disease. She is not sure if she carries the trait as they were half siblings.  No personal or known familial history of cancer.  She has a clip in the left breast marking overlapping fibroglandular tissue and is due for her mammogram again in July 2024.  She has never had a colonoscopy.  She recently had a rash come out on her arms from what is felt to be an allergy to beets. This continues to resolve. No itching or burning. She has some lingering hyperpigmentation on both arms where the rash had been. She is using Cetaphil cream.  She has 2 healthy sons and history of multiple miscarriages.  She had 2 c-sections without any complications.  She is borderline diabetic with Hgb A1c of 6.3. She is focusing on her diet and exercise and is not on any medication for this.  She has history of HTN and is seen by Dr. Harrington Challenger with cardiology.  No history of thyroid disease.  No fever, chills, n/v, cough, dizziness, SOB, chest pain, palpitations, abdominal pain or changes in bowel or bladder habits.  She has constipation off and on and drinks prune juice when needed to resolve.  No swelling in  her extremities at this time.  She has varicose veins effecting both legs and states that she has been referred to vascular.  No falls or syncope reported.  Appetite and hydration are good. Weight is stable at 216 lbs.  No smoking, ETOH or recreational drug use.  She walks a lot throughout the day for her job.  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 She is originally from [place].   ROS: All other 10 point review of systems is negative.   PAST MEDICAL HISTORY:   Past Medical History:  Diagnosis Date   Beta thalassemia trait    Essential hypertension, benign 06/12/2012   [facility] abstract note also started on two  new meds for this see med list.   No pertinent past medical history     ALLERGIES: Allergies  Allergen Reactions   Beet [Beta Vulgaris]    Amoxicillin Rash      MEDICATIONS:  Current Outpatient Medications on File Prior to Visit  Medication Sig Dispense Refill   amLODipine (NORVASC) 10 MG tablet Take 1 tablet (10 mg total) by mouth daily. 90 tablet 1   carvedilol (COREG) 12.5 MG tablet Take 1 tablet (12.5 mg total) by mouth 2 (two) times daily with a meal. 180 tablet 3   Cholecalciferol 100 MCG (4000 UT) TABS Take 4,000 Units by mouth daily.     famotidine (  PEPCID) 40 MG tablet Take 1 tablet (40 mg total) by mouth daily. 100 tablet 3   fluticasone (FLONASE) 50 MCG/ACT nasal spray SHAKE LIQUID AND USE 2 SPRAYS IN EACH NOSTRIL DAILY 48 g 2   hydrOXYzine (VISTARIL) 25 MG capsule Take 1 capsule (25 mg total) by mouth every 8 (eight) hours as needed for itching. 60 capsule 1   meloxicam (MOBIC) 15 MG tablet TAKE 1 TABLET BY MOUTH DAILY AS NEEDED FOR KNEE PAIN 90 tablet 0   Multiple Vitamin (MULTIVITAMIN) tablet Take 1 tablet by mouth daily.     zinc gluconate 50 MG tablet Take 50 mg by mouth daily.     No current facility-administered medications on file prior to visit.     PAST SURGICAL HISTORY Past Surgical History:   Procedure Laterality Date   CESAREAN SECTION     CESAREAN SECTION  04/17/2012   Procedure: CESAREAN SECTION;  Surgeon: Shelly Bombard, MD;  Location: [facility];  Service: Gynecology;  Laterality: N/A;    FAMILY HISTORY: Family History  Problem Relation Age of Onset   Sickle cell anemia Brother    Hypertension Mother     SOCIAL HISTORY:  reports that she has never smoked. She has never used smokeless tobacco. She reports that she does not drink alcohol and does not use drugs.  PERFORMANCE STATUS: The patient's performance status is 1 - Symptomatic but completely ambulatory  PHYSICAL EXAM: Most Recent Vital Signs: unknown if currently breastfeeding. There were no vitals taken for this visit.  General Appearance:    Alert, cooperative, no distress, appears stated age  Head:    Normocephalic, without obvious abnormality, atraumatic  Eyes:    PERRL, conjunctiva/corneas clear, EOM's intact, fundi    benign, both eyes        Throat:   Lips, mucosa, and tongue normal; teeth and gums normal  Neck:   Supple, symmetrical, trachea midline, no adenopathy;    thyroid:  no enlargement/tenderness/nodules; no carotid   bruit or JVD  Back:     Symmetric, no curvature, ROM normal, no CVA tenderness  Lungs:     Clear to auscultation bilaterally, respirations unlabored  Chest Wall:    No tenderness or deformity   Heart:    Regular rate and rhythm, S1 and S2 normal, no murmur, rub   or gallop     Abdomen:     Soft, non-tender, bowel sounds active all four quadrants,    no masses, no organomegaly        Extremities:   Extremities normal, atraumatic, no cyanosis or edema  Pulses:   2+ and symmetric all extremities  Skin:   Skin color, texture, turgor normal, no rashes or lesions  Lymph nodes:   Cervical, supraclavicular, and axillary nodes normal  Neurologic:   CNII-XII intact, normal strength, sensation and reflexes    throughout    LABORATORY DATA:  Results for orders placed or performed  in visit on 10/31/22 (from the past 48 hour(s))  QuantiFERON-TB Gold Plus     Status: None   Collection Time: 10/31/22 10:52 AM  Result Value Ref Range   QuantiFERON-TB Gold Plus NEGATIVE NEGATIVE    Comment: Negative test result. M. tuberculosis complex  infection unlikely.    NIL 0.09 IU/mL   Mitogen-NIL >10.00 IU/mL   TB1-NIL 0.00 IU/mL   TB2-NIL 0.00 IU/mL    Comment: . The Nil tube value reflects the background interferon gamma immune response of the patient's blood sample. This value has been  subtracted from the patient's displayed TB and Mitogen results. . Lower than expected results with the Mitogen tube prevent false-negative Quantiferon readings by detecting a patient with a potential immune suppressive condition and/or suboptimal pre-analytical specimen handling. . The TB1 Antigen tube is coated with the M. tuberculosis-specific antigens designed to elicit responses from TB antigen primed CD4+ helper T-lymphocytes. . The TB2 Antigen tube is coated with the M. tuberculosis-specific antigens designed to elicit responses from TB antigen primed CD4+ helper and CD8+ cytotoxic T-lymphocytes. . For additional information, please refer to https://education.[facility].com/faq/FAQ204 (This link is being provided for informational/ educational purposes only.) .   Hemoglobin A1c     Status: None   Collection Time: 10/31/22 10:52 AM  Result Value Ref Range   Hgb A1c MFr Bld 6.3 4.6 - 6.5 %    Comment: Glycemic Control Guidelines for People with Diabetes:Non Diabetic:  <6%Goal of Therapy: <7%Additional Action Suggested:  >8%   Comprehensive metabolic panel     Status: None   Collection Time: 10/31/22 10:52 AM  Result Value Ref Range   Sodium 138 135 - 145 mEq/L   Potassium 3.6 3.5 - 5.1 mEq/L   Chloride 104 96 - 112 mEq/L   CO2 27 19 - 32 mEq/L   Glucose, Bld 92 70 - 99 mg/dL   BUN 11 6 - 23 mg/dL   Creatinine, Ser 0.63 0.40 - 1.20 mg/dL   Total Bilirubin 0.3  0.2 - 1.2 mg/dL   Alkaline Phosphatase 53 39 - 117 U/L   AST 13 0 - 37 U/L   ALT 7 0 - 35 U/L   Total Protein 6.7 6.0 - 8.3 g/dL   Albumin 3.7 3.5 - 5.2 g/dL   GFR 104.57 >60.00 mL/min    Comment: Calculated using the CKD-EPI Creatinine Equation (2021)   Calcium 9.0 8.4 - 10.5 mg/dL  CBC     Status: Abnormal   Collection Time: 10/31/22 10:52 AM  Result Value Ref Range   WBC 5.7 4.0 - 10.5 K/uL   RBC 4.43 3.87 - 5.11 Mil/uL   Platelets 237.0 150.0 - 400.0 K/uL   Hemoglobin 8.4 Repeated and verified X2. (L) 12.0 - 15.0 g/dL   HCT 27.7 (L) 36.0 - 46.0 %   MCV 62.7 Repeated and verified X2. (L) 78.0 - 100.0 fl   MCHC 30.1 30.0 - 36.0 g/dL   RDW 22.9 (H) 11.5 - 15.5 %  TSH     Status: None   Collection Time: 10/31/22 10:52 AM  Result Value Ref Range   TSH 0.45 0.35 - 5.50 uIU/mL  Ferritin     Status: Abnormal   Collection Time: 10/31/22 10:52 AM  Result Value Ref Range   Ferritin 5.3 (L) 10.0 - 291.0 ng/mL  B12     Status: None   Collection Time: 10/31/22 10:52 AM  Result Value Ref Range   Vitamin B-12 771 211 - 911 pg/mL  Folate     Status: None   Collection Time: 10/31/22 10:52 AM  Result Value Ref Range   Folate 15.6 >5.9 ng/mL  Measles/Mumps/Rubella Immunity     Status: None   Collection Time: 10/31/22 10:52 AM  Result Value Ref Range   Rubeola IgG >300.00 AU/mL    Comment: AU/mL            Interpretation -----            -------------- <13.50           Not consistent with immunity 13.50-16.49  Equivocal >16.49           Consistent with immunity . The presence of measles IgG suggests immunization or past or current infection with measles virus. . For additional information, please refer to http://education.[facility].com/faq/FAQ162 (This link is being provided for informational/ educational purposes only.) .    Mumps IgG 25.80 AU/mL    Comment:  AU/mL           Interpretation -------         ---------------- <9.00             Not consistent with  immunity 9.00-10.99        Equivocal >10.99            Consistent with immunity . The presence of mumps IgG antibody suggests immunization or past or current infection with mumps virus. .    Rubella 29.60 Index    Comment:     Index            Interpretation     -----            --------------       <0.90            Not consistent with immunity     0.90-0.99        Equivocal     > or = 1.00      Consistent with immunity  . The presence of rubella IgG antibody suggests  immunization or past or current infection with rubella virus.       RADIOGRAPHY: No results found.     PATHOLOGY: None  ASSESSMENT/PLAN: Ms. Pinyan is a very pleasant 47 yo [place] American female with history of both iron deficiency anemia as well as the beta thalassemia trait.  We will get her set up for 3 doses of Venofer starting this week on Friday.  She will also start Folic acid 1 mg PO daily.  Follow-up in 5 weeks.  All questions were answered. The patient knows to call the clinic with any problems, questions or concerns. We can certainly see the patient much sooner if necessary.   Lottie Dawson, NP

## 2022-11-02 NOTE — Addendum Note (Signed)
Addended by: Darreld Mclean on: 11/02/2022 05:45 AM   Modules accepted: Orders

## 2022-11-03 LAB — ERYTHROPOIETIN: Erythropoietin: 41.7 m[IU]/mL — ABNORMAL HIGH (ref 2.6–18.5)

## 2022-11-03 MED ORDER — NORETHINDRONE 0.35 MG PO TABS: 1.0000 | ORAL_TABLET | Freq: Every day | ORAL | 3 refills | Status: DC

## 2022-11-03 MED ORDER — LEVONORGESTREL-ETHINYL ESTRAD 0.1-20 MG-MCG PO TABS: 1.0000 | ORAL_TABLET | Freq: Every day | ORAL | 3 refills | Status: DC

## 2022-11-07 LAB — HGB FRACTIONATION CASCADE
Hgb A2: 5.3 % — ABNORMAL HIGH (ref 1.8–3.2)
Hgb A: 91.9 % — ABNORMAL LOW (ref 96.4–98.8)
Hgb F: 2.8 % — ABNORMAL HIGH (ref 0.0–2.0)
Hgb S: 0 %

## 2022-11-11 ENCOUNTER — Inpatient Hospital Stay: Payer: PRIVATE HEALTH INSURANCE | Attending: Hematology & Oncology

## 2022-11-11 VITALS — BP 140/83 | HR 78 | Temp 98.6°F | Resp 17

## 2022-11-11 DIAGNOSIS — D509 Iron deficiency anemia, unspecified: Secondary | ICD-10-CM | POA: Insufficient documentation

## 2022-11-11 DIAGNOSIS — D500 Iron deficiency anemia secondary to blood loss (chronic): Secondary | ICD-10-CM

## 2022-11-11 MED ORDER — SODIUM CHLORIDE 0.9 % IV SOLN
300.0000 mg | Freq: Once | INTRAVENOUS | Status: AC
  Administered 2022-11-11: 300 mg via INTRAVENOUS
  Filled 2022-11-11: qty 300

## 2022-11-11 MED ORDER — SODIUM CHLORIDE 0.9 % IV SOLN: Freq: Once | INTRAVENOUS | Status: AC

## 2022-11-11 NOTE — Patient Instructions (Signed)

## 2022-11-16 LAB — ALPHA-THALASSEMIA GENOTYPR

## 2022-11-18 ENCOUNTER — Inpatient Hospital Stay: Payer: PRIVATE HEALTH INSURANCE

## 2022-11-18 VITALS — BP 132/90 | HR 68 | Temp 98.7°F | Resp 18

## 2022-11-18 DIAGNOSIS — D509 Iron deficiency anemia, unspecified: Secondary | ICD-10-CM | POA: Diagnosis not present

## 2022-11-18 DIAGNOSIS — D500 Iron deficiency anemia secondary to blood loss (chronic): Secondary | ICD-10-CM

## 2022-11-18 MED ORDER — SODIUM CHLORIDE 0.9 % IV SOLN: Freq: Once | INTRAVENOUS | Status: AC

## 2022-11-18 MED ORDER — SODIUM CHLORIDE 0.9 % IV SOLN
300.0000 mg | Freq: Once | INTRAVENOUS | Status: AC
  Administered 2022-11-18: 300 mg via INTRAVENOUS
  Filled 2022-11-18: qty 300

## 2022-11-18 NOTE — Patient Instructions (Signed)

## 2022-11-25 ENCOUNTER — Inpatient Hospital Stay: Payer: PRIVATE HEALTH INSURANCE

## 2022-11-25 VITALS — BP 140/75 | HR 70 | Temp 98.0°F | Resp 16

## 2022-11-25 DIAGNOSIS — D500 Iron deficiency anemia secondary to blood loss (chronic): Secondary | ICD-10-CM

## 2022-11-25 DIAGNOSIS — D509 Iron deficiency anemia, unspecified: Secondary | ICD-10-CM | POA: Diagnosis not present

## 2022-11-25 MED ORDER — SODIUM CHLORIDE 0.9 % IV SOLN: Freq: Once | INTRAVENOUS | Status: AC

## 2022-11-25 MED ORDER — SODIUM CHLORIDE 0.9 % IV SOLN
300.0000 mg | Freq: Once | INTRAVENOUS | Status: AC
  Administered 2022-11-25: 300 mg via INTRAVENOUS
  Filled 2022-11-25: qty 300

## 2022-11-25 NOTE — Patient Instructions (Signed)

## 2022-11-28 NOTE — Progress Notes (Unsigned)
Koi A Stater is a 47 y.o. female presents to the office today for 2nd Hep B injection, per physician's orders. Original order: 10/31/2022 Hepisav- B (med), 0.5 ML ,  IM was administered R deltoid today. Patient tolerated injection.   Creft, Darlis Loan

## 2022-11-29 ENCOUNTER — Ambulatory Visit (INDEPENDENT_AMBULATORY_CARE_PROVIDER_SITE_OTHER): Payer: PRIVATE HEALTH INSURANCE

## 2022-11-29 DIAGNOSIS — Z23 Encounter for immunization: Secondary | ICD-10-CM

## 2022-12-07 ENCOUNTER — Inpatient Hospital Stay: Payer: PRIVATE HEALTH INSURANCE | Attending: Hematology & Oncology

## 2022-12-07 ENCOUNTER — Inpatient Hospital Stay: Payer: PRIVATE HEALTH INSURANCE | Admitting: Family

## 2022-12-07 VITALS — BP 133/70 | HR 79 | Temp 97.8°F | Resp 16 | Wt 220.0 lb

## 2022-12-07 DIAGNOSIS — D500 Iron deficiency anemia secondary to blood loss (chronic): Secondary | ICD-10-CM | POA: Diagnosis not present

## 2022-12-07 DIAGNOSIS — D563 Thalassemia minor: Secondary | ICD-10-CM

## 2022-12-07 DIAGNOSIS — D509 Iron deficiency anemia, unspecified: Secondary | ICD-10-CM | POA: Diagnosis not present

## 2022-12-07 LAB — CBC WITH DIFFERENTIAL (CANCER CENTER ONLY)
Abs Immature Granulocytes: 0.03 10*3/uL (ref 0.00–0.07)
Basophils Absolute: 0 10*3/uL (ref 0.0–0.1)
Basophils Relative: 0 %
Eosinophils Absolute: 0.2 10*3/uL (ref 0.0–0.5)
Eosinophils Relative: 3 %
HCT: 31.5 % — ABNORMAL LOW (ref 36.0–46.0)
Hemoglobin: 9.4 g/dL — ABNORMAL LOW (ref 12.0–15.0)
Immature Granulocytes: 1 %
Lymphocytes Relative: 47 %
Lymphs Abs: 2.3 10*3/uL (ref 0.7–4.0)
MCH: 20.2 pg — ABNORMAL LOW (ref 26.0–34.0)
MCHC: 29.8 g/dL — ABNORMAL LOW (ref 30.0–36.0)
MCV: 67.6 fL — ABNORMAL LOW (ref 80.0–100.0)
Monocytes Absolute: 0.4 10*3/uL (ref 0.1–1.0)
Monocytes Relative: 8 %
Neutro Abs: 2 10*3/uL (ref 1.7–7.7)
Neutrophils Relative %: 41 %
Platelet Count: 227 10*3/uL (ref 150–400)
RBC: 4.66 MIL/uL (ref 3.87–5.11)
RDW: 23.6 % — ABNORMAL HIGH (ref 11.5–15.5)
WBC Count: 4.9 10*3/uL (ref 4.0–10.5)
nRBC: 0 % (ref 0.0–0.2)

## 2022-12-07 LAB — IRON AND IRON BINDING CAPACITY (CC-WL,HP ONLY)
Iron: 60 ug/dL (ref 28–170)
Saturation Ratios: 17 % (ref 10.4–31.8)
TIBC: 346 ug/dL (ref 250–450)
UIBC: 286 ug/dL (ref 148–442)

## 2022-12-07 LAB — RETICULOCYTES
Immature Retic Fract: 26.6 % — ABNORMAL HIGH (ref 2.3–15.9)
RBC.: 4.64 MIL/uL (ref 3.87–5.11)
Retic Count, Absolute: 81.2 10*3/uL (ref 19.0–186.0)
Retic Ct Pct: 1.8 % (ref 0.4–3.1)

## 2022-12-07 LAB — FERRITIN: Ferritin: 82 ng/mL (ref 11–307)

## 2022-12-07 NOTE — Progress Notes (Signed)
Hematology and Oncology Follow Up Visit  [name] [id number] 1976-03-27 47 y.o. 12/07/2022   Principle Diagnosis:  Iron deficiency anemia  Beta Thalassemia trait  Current Therapy:   IV iron as indicated Folic acid 1 mg PO daily    Interim History:  [name] is here today for follow-up. She is doing well and notes that her fatigue, SOB with exertion and palpitations re all much improved.  She has started Micronor and her most recent cycle was much lighter. No other blood loss noted.  No bruising or petechiae.  No fever, chills, n/v, cough, rash, dizziness, SOB, chest pain, palpitations, abdominal pain or changes in bowel or bladder habits at this time.  No swelling, tenderness, numbness or tingling in her extremities.  No falls or syncope.  Appetite and hydration are good. Weight is stable at 220 lbs.   ECOG Performance Status: 1 - Symptomatic but completely ambulatory  Medications:  Allergies as of 12/07/2022       Reactions   Beet [beta Vulgaris]    Amoxicillin Rash        Medication List        Accurate as of December 07, 2022 10:00 AM. If you have any questions, ask your nurse or doctor.          amLODipine 10 MG tablet Commonly known as: NORVASC Take 1 tablet (10 mg total) by mouth daily.   carvedilol 12.5 MG tablet Commonly known as: COREG Take 1 tablet (12.5 mg total) by mouth 2 (two) times daily with a meal.   Cholecalciferol 100 MCG (4000 UT) Tabs Take 4,000 Units by mouth daily.   famotidine 40 MG tablet Commonly known as: Pepcid Take 1 tablet (40 mg total) by mouth daily.   fluticasone 50 MCG/ACT nasal spray Commonly known as: FLONASE SHAKE LIQUID AND USE 2 SPRAYS IN EACH NOSTRIL DAILY   folic acid 1 MG tablet Commonly known as: FOLVITE Take 1 tablet (1 mg total) by mouth daily.   hydrOXYzine 25 MG capsule Commonly known as: VISTARIL Take 1 capsule (25 mg total) by mouth every 8 (eight) hours as needed for itching.   meloxicam  15 MG tablet Commonly known as: MOBIC TAKE 1 TABLET BY MOUTH DAILY AS NEEDED FOR KNEE PAIN   multivitamin tablet Take 1 tablet by mouth daily.   norethindrone 0.35 MG tablet Commonly known as: Ortho Micronor Take 1 tablet (0.35 mg total) by mouth daily.   zinc gluconate 50 MG tablet Take 50 mg by mouth daily.        Allergies:  Allergies  Allergen Reactions   Beet [Beta Vulgaris]    Amoxicillin Rash    Past Medical History, Surgical history, Social history, and Family History were reviewed and updated.  Review of Systems: All other 10 point review of systems is negative.   Physical Exam:  weight is 220 lb (99.8 kg). Her oral temperature is 97.8 F (36.6 C). Her blood pressure is 133/70 and her pulse is 79. Her respiration is 16 and oxygen saturation is 100%.   Wt Readings from Last 3 Encounters:  12/07/22 220 lb (99.8 kg)  11/02/22 216 lb (98 kg)  10/31/22 218 lb 6.4 oz (99.1 kg)    Ocular: Sclerae unicteric, pupils equal, round and reactive to light Ear-nose-throat: Oropharynx clear, dentition fair Lymphatic: No cervical or supraclavicular adenopathy Lungs no rales or rhonchi, good excursion bilaterally Heart regular rate and rhythm, no murmur appreciated Abd soft, nontender, positive bowel sounds MSK no focal  spinal tenderness, no joint edema Neuro: non-focal, well-oriented, appropriate affect Breasts: Deferred   Lab Results  Component Value Date   WBC 4.9 12/07/2022   HGB 9.4 (L) 12/07/2022   HCT 31.5 (L) 12/07/2022   MCV 67.6 (L) 12/07/2022   PLT 227 12/07/2022   Lab Results  Component Value Date   FERRITIN 5.3 (L) 10/31/2022   IRON 20 (L) 11/02/2022   TIBC 437 11/02/2022   UIBC 417 11/02/2022   IRONPCTSAT 5 (L) 11/02/2022   Lab Results  Component Value Date   RETICCTPCT 1.8 12/07/2022   RBC 4.66 12/07/2022   RETICCTABS 46.10 11/29/2010   No results found for: "KPAFRELGTCHN", "LAMBDASER", "KAPLAMBRATIO" No results found for: "IGGSERUM",  "IGA", "IGMSERUM" No results found for: "TOTALPROTELP", "ALBUMINELP", "A1GS", "A2GS", "BETS", "BETA2SER", "GAMS", "MSPIKE", "SPEI"   Chemistry      Component Value Date/Time   NA 138 10/31/2022 1052   NA 136 07/27/2022 1010   K 3.6 10/31/2022 1052   CL 104 10/31/2022 1052   CO2 27 10/31/2022 1052   BUN 11 10/31/2022 1052   BUN 10 07/27/2022 1010   CREATININE 0.63 10/31/2022 1052   CREATININE 0.81 06/24/2016 1015      Component Value Date/Time   CALCIUM 9.0 10/31/2022 1052   ALKPHOS 53 10/31/2022 1052   AST 13 10/31/2022 1052   ALT 7 10/31/2022 1052   BILITOT 0.3 10/31/2022 1052   BILITOT 0.4 07/27/2022 1010       Impression and Plan: [name] is a very pleasant 47 yo African American female with history of both iron deficiency anemia as well as the beta thalassemia trait.  Iron studies are pending. We will replace if needed.  Continues Folic acid 1 mg PO daily.  Follow-up in 3 months  Lottie Dawson, NP 4/3/202410:00 AM

## 2023-02-02 ENCOUNTER — Ambulatory Visit (HOSPITAL_BASED_OUTPATIENT_CLINIC_OR_DEPARTMENT_OTHER)
Admission: RE | Admit: 2023-02-02 | Discharge: 2023-02-02 | Disposition: A | Discharge status: Home / Self Care | Payer: PRIVATE HEALTH INSURANCE | Source: Ambulatory Visit | Attending: Family Medicine | Admitting: Family Medicine

## 2023-02-02 ENCOUNTER — Ambulatory Visit: Payer: PRIVATE HEALTH INSURANCE | Admitting: Family Medicine

## 2023-02-02 VITALS — BP 130/80 | HR 69 | Temp 98.0°F | Resp 18 | Ht 65.5 in | Wt 219.0 lb

## 2023-02-02 DIAGNOSIS — L03113 Cellulitis of right upper limb: Secondary | ICD-10-CM | POA: Insufficient documentation

## 2023-02-02 LAB — CBC WITH DIFFERENTIAL/PLATELET
Basophils Absolute: 0 10*3/uL (ref 0.0–0.1)
Basophils Relative: 0.6 % (ref 0.0–3.0)
Eosinophils Absolute: 0.2 10*3/uL (ref 0.0–0.7)
Eosinophils Relative: 3.3 % (ref 0.0–5.0)
HCT: 35.7 % — ABNORMAL LOW (ref 36.0–46.0)
Hemoglobin: 10.8 g/dL — ABNORMAL LOW (ref 12.0–15.0)
Lymphocytes Relative: 35.5 % (ref 12.0–46.0)
Lymphs Abs: 1.7 10*3/uL (ref 0.7–4.0)
MCHC: 30.3 g/dL (ref 30.0–36.0)
MCV: 69.5 fl — ABNORMAL LOW (ref 78.0–100.0)
Monocytes Absolute: 0.4 10*3/uL (ref 0.1–1.0)
Monocytes Relative: 7.3 % (ref 3.0–12.0)
Neutro Abs: 2.6 10*3/uL (ref 1.4–7.7)
Neutrophils Relative %: 53.3 % (ref 43.0–77.0)
Platelets: 236 10*3/uL (ref 150.0–400.0)
RBC: 5.13 Mil/uL — ABNORMAL HIGH (ref 3.87–5.11)
RDW: 18 % — ABNORMAL HIGH (ref 11.5–15.5)
WBC: 4.9 10*3/uL (ref 4.0–10.5)

## 2023-02-02 LAB — COMPREHENSIVE METABOLIC PANEL
ALT: 10 U/L (ref 0–35)
AST: 11 U/L (ref 0–37)
Albumin: 4.2 g/dL (ref 3.5–5.2)
Alkaline Phosphatase: 42 U/L (ref 39–117)
BUN: 12 mg/dL (ref 6–23)
CO2: 27 mEq/L (ref 19–32)
Calcium: 9.2 mg/dL (ref 8.4–10.5)
Chloride: 103 mEq/L (ref 96–112)
Creatinine, Ser: 0.78 mg/dL (ref 0.40–1.20)
GFR: 89.38 mL/min (ref >60.00)
Glucose, Bld: 107 mg/dL — ABNORMAL HIGH (ref 70–99)
Potassium: 3.7 mEq/L (ref 3.5–5.1)
Sodium: 138 mEq/L (ref 135–145)
Total Bilirubin: 0.5 mg/dL (ref 0.2–1.2)
Total Protein: 7.3 g/dL (ref 6.0–8.3)

## 2023-02-02 LAB — URIC ACID: Uric Acid, Serum: 5.1 mg/dL (ref 2.4–7.0)

## 2023-02-02 MED ORDER — DOXYCYCLINE HYCLATE 100 MG PO TABS
100.0000 mg | ORAL_TABLET | Freq: Two times a day (BID) | ORAL | 0 refills | Status: DC
Start: 2023-02-02 — End: 2023-03-08

## 2023-02-02 NOTE — Progress Notes (Signed)
Subjective:   By signing my name below, I, Doylene Bode, attest that this documentation has been prepared under the direction and in the presence of Donato Schultz, DO 02/02/23   Patient ID: [name], female    DOB: 15-Aug-1976, 47 y.o.   MRN: [id number]  Chief Complaint  Patient presents with   Hand Pain    Sxs started Friday last week, pt states having pain and swelling. No numbness. No falls or injuries    Right Hand Pain Patient is in today for pain, swelling in right hand that started after waking up on 01/27/23. She did not fall and was not stung by insect. Last iron infusion in late 11/2022. 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 Taking ibuprofen. Has tried wrapping it. Denies history of gout.  Past Medical History:  Diagnosis Date   Beta thalassemia trait    Essential hypertension, benign 06/12/2012   [facility] abstract note also started on two  new meds for this see med list.   No pertinent past medical history     Past Surgical History:  Procedure Laterality Date   CESAREAN SECTION     CESAREAN SECTION  04/17/2012   Procedure: CESAREAN SECTION;  Surgeon: Brock Bad, MD;  Location: [facility];  Service: Gynecology;  Laterality: N/A;    Family History  Problem Relation Age of Onset   Sickle cell anemia Brother    Hypertension Mother     Social History   Socioeconomic History   Marital status: Married    Spouse name: Not on file   Number of children: Not on file   Years of education: Not on file   Highest education level: Associate degree: occupational, Scientist, product/process development, or vocational program  Occupational History   Not on file  Tobacco Use   Smoking status: Never   Smokeless tobacco: Never  Vaping Use   Vaping Use: Never used  Substance and Sexual Activity   Alcohol use: No   Drug use: No   Sexual activity: Yes  Other Topics Concern   Not on file  Social History Narrative   Not on file   Social Determinants of  Health   Financial Resource Strain: Low Risk  (02/02/2023)   Overall Financial Resource Strain (CARDIA)    Difficulty of Paying Living Expenses: Not hard at all  Food Insecurity: No Food Insecurity (02/02/2023)   Hunger Vital Sign    Worried About Running Out of Food in the Last Year: Never true    Ran Out of Food in the Last Year: Never true  Transportation Needs: No Transportation Needs (02/02/2023)   PRAPARE - Administrator, Civil Service (Medical): No    Lack of Transportation (Non-Medical): No  Physical Activity: Unknown (02/02/2023)   Exercise Vital Sign    Days of Exercise per Week: 4 days    Minutes of Exercise per Session: Patient declined  Stress: No Stress Concern Present (02/02/2023)   [employer] of Occupational Health - Occupational Stress Questionnaire    Feeling of Stress : Not at all  Social Connections: Unknown (02/02/2023)   Social Connection and Isolation Panel [NHANES]    Frequency of Communication with Friends and Family: More than three times a week    Frequency of Social Gatherings with Friends and Family: More than three times a week    Attends Religious Services: Patient declined    Database administrator or Organizations: Patient declined  Attends Banker Meetings: Not on file    Marital Status: Patient declined  Intimate Partner Violence: Not At Risk (11/02/2022)   Humiliation, Afraid, Rape, and Kick questionnaire    Fear of Current or Ex-Partner: No    Emotionally Abused: No    Physically Abused: No    Sexually Abused: No    Outpatient Medications Prior to Visit  Medication Sig Dispense Refill   amLODipine (NORVASC) 10 MG tablet Take 1 tablet (10 mg total) by mouth daily. 90 tablet 1   carvedilol (COREG) 12.5 MG tablet Take 1 tablet (12.5 mg total) by mouth 2 (two) times daily with a meal. 180 tablet 3   Cholecalciferol 100 MCG (4000 UT) TABS Take 4,000 Units by mouth daily.     famotidine (PEPCID) 40 MG tablet Take 1  tablet (40 mg total) by mouth daily. 100 tablet 3   fluticasone (FLONASE) 50 MCG/ACT nasal spray SHAKE LIQUID AND USE 2 SPRAYS IN EACH NOSTRIL DAILY 48 g 2   folic acid (FOLVITE) 1 MG tablet Take 1 tablet (1 mg total) by mouth daily. 30 tablet 11   hydrOXYzine (VISTARIL) 25 MG capsule Take 1 capsule (25 mg total) by mouth every 8 (eight) hours as needed for itching. 60 capsule 1   meloxicam (MOBIC) 15 MG tablet TAKE 1 TABLET BY MOUTH DAILY AS NEEDED FOR KNEE PAIN 90 tablet 0   Multiple Vitamin (MULTIVITAMIN) tablet Take 1 tablet by mouth daily.     norethindrone (ORTHO MICRONOR) 0.35 MG tablet Take 1 tablet (0.35 mg total) by mouth daily. 82 tablet 3   zinc gluconate 50 MG tablet Take 50 mg by mouth daily.     No facility-administered medications prior to visit.    Allergies  Allergen Reactions   Beet [Beta Vulgaris]    Amoxicillin Rash    Review of Systems  Constitutional:  Negative for fever and malaise/fatigue.  HENT:  Negative for congestion.   Eyes:  Negative for blurred vision.  Respiratory:  Negative for cough and shortness of breath.   Cardiovascular:  Negative for chest pain, palpitations and leg swelling.  Gastrointestinal:  Negative for abdominal pain, blood in stool, nausea and vomiting.  Genitourinary:  Negative for dysuria and frequency.  Musculoskeletal:  Negative for back pain and falls.       (+) Pain/swelling right hand  Skin:  Negative for rash.  Neurological:  Negative for dizziness, loss of consciousness and headaches.  Endo/Heme/Allergies:  Negative for environmental allergies.  Psychiatric/Behavioral:  Negative for depression. The patient is not nervous/anxious.        Objective:    Physical Exam Vitals and nursing note reviewed.  Constitutional:      Appearance: Normal appearance. She is well-developed.  HENT:     Head: Normocephalic and atraumatic.  Eyes:     General: Lids are normal.     Extraocular Movements: Extraocular movements intact.      Conjunctiva/sclera: Conjunctivae normal.  Pulmonary:     Effort: Pulmonary effort is normal.  Musculoskeletal:        General: Swelling and tenderness present. Normal range of motion.     Cervical back: Normal range of motion and neck supple.     Comments: Swollen, erythematous right hand. Pain with palpation.  Skin:    General: Skin is warm and dry.  Neurological:     Mental Status: She is alert and oriented to person, place, and time.  Psychiatric:        Attention and  Perception: Attention and perception normal.        Mood and Affect: Mood normal.        Behavior: Behavior normal.        Thought Content: Thought content normal.        Judgment: Judgment normal.     BP 130/80 (BP Location: Left Arm, Patient Position: Sitting, Cuff Size: Large)   Pulse 69   Temp 98 F (36.7 C) (Oral)   Resp 18   Ht 5' 5.5" (1.664 m)   Wt 219 lb (99.3 kg)   SpO2 99%   BMI 35.89 kg/m  Wt Readings from Last 3 Encounters:  02/02/23 219 lb (99.3 kg)  12/07/22 220 lb (99.8 kg)  11/02/22 216 lb (98 kg)       Assessment & Plan:  Cellulitis of right hand Assessment & Plan: Abx per orders Xray and labs Return to office next week to recheck   Orders: -     CBC with Differential/Platelet -     Comprehensive metabolic panel -     Uric acid -     DG Hand Complete Right; Future  Cellulitis of right upper extremity -     Doxycycline Hyclate; Take 1 tablet (100 mg total) by mouth 2 (two) times daily.  Dispense: 20 tablet; Refill: 0    I, Donato Schultz, DO, have reviewed all documentation for this visit. The documentation on 02/02/23 for the exam, diagnosis, procedures, and orders are all accurate and complete.   I,Alexander Ruley,acting as a Neurosurgeon for [employer], DO.,have documented all relevant documentation on the behalf of Donato Schultz, DO,as directed by  Donato Schultz, DO while in the presence of Donato Schultz, DO.

## 2023-02-02 NOTE — Assessment & Plan Note (Signed)
Abx per orders Xray and labs Return to office next week to recheck

## 2023-02-10 ENCOUNTER — Encounter: Payer: Self-pay | Admitting: *Deleted

## 2023-03-06 ENCOUNTER — Encounter: Payer: Self-pay | Admitting: Family

## 2023-03-08 ENCOUNTER — Inpatient Hospital Stay: Payer: PRIVATE HEALTH INSURANCE | Attending: Hematology & Oncology

## 2023-03-08 ENCOUNTER — Encounter: Payer: Self-pay | Admitting: Family

## 2023-03-08 ENCOUNTER — Inpatient Hospital Stay: Payer: PRIVATE HEALTH INSURANCE | Admitting: Family

## 2023-03-08 VITALS — BP 124/84 | HR 71 | Temp 98.3°F | Resp 16 | Ht 65.0 in | Wt 218.1 lb

## 2023-03-08 DIAGNOSIS — D500 Iron deficiency anemia secondary to blood loss (chronic): Secondary | ICD-10-CM | POA: Diagnosis not present

## 2023-03-08 DIAGNOSIS — D563 Thalassemia minor: Secondary | ICD-10-CM | POA: Diagnosis not present

## 2023-03-08 DIAGNOSIS — D509 Iron deficiency anemia, unspecified: Secondary | ICD-10-CM | POA: Insufficient documentation

## 2023-03-08 LAB — RETICULOCYTES
Immature Retic Fract: 14.2 % (ref 2.3–15.9)
RBC.: 5.29 MIL/uL — ABNORMAL HIGH (ref 3.87–5.11)
Retic Count, Absolute: 41.3 10*3/uL (ref 19.0–186.0)
Retic Ct Pct: 0.8 % (ref 0.4–3.1)

## 2023-03-08 LAB — FERRITIN: Ferritin: 19 ng/mL (ref 11–307)

## 2023-03-08 LAB — CBC WITH DIFFERENTIAL (CANCER CENTER ONLY)
Abs Immature Granulocytes: 0.01 10*3/uL (ref 0.00–0.07)
Basophils Absolute: 0 10*3/uL (ref 0.0–0.1)
Basophils Relative: 0 %
Eosinophils Absolute: 0.2 10*3/uL (ref 0.0–0.5)
Eosinophils Relative: 4 %
HCT: 36 % (ref 36.0–46.0)
Hemoglobin: 11.2 g/dL — ABNORMAL LOW (ref 12.0–15.0)
Immature Granulocytes: 0 %
Lymphocytes Relative: 42 %
Lymphs Abs: 2.1 10*3/uL (ref 0.7–4.0)
MCH: 21.1 pg — ABNORMAL LOW (ref 26.0–34.0)
MCHC: 31.1 g/dL (ref 30.0–36.0)
MCV: 67.9 fL — ABNORMAL LOW (ref 80.0–100.0)
Monocytes Absolute: 0.3 10*3/uL (ref 0.1–1.0)
Monocytes Relative: 7 %
Neutro Abs: 2.3 10*3/uL (ref 1.7–7.7)
Neutrophils Relative %: 47 %
Platelet Count: 214 10*3/uL (ref 150–400)
RBC: 5.3 MIL/uL — ABNORMAL HIGH (ref 3.87–5.11)
RDW: 17.2 % — ABNORMAL HIGH (ref 11.5–15.5)
WBC Count: 5 10*3/uL (ref 4.0–10.5)
nRBC: 0 % (ref 0.0–0.2)

## 2023-03-08 LAB — IRON AND IRON BINDING CAPACITY (CC-WL,HP ONLY)
Iron: 33 ug/dL (ref 28–170)
Saturation Ratios: 9 % — ABNORMAL LOW (ref 10.4–31.8)
TIBC: 378 ug/dL (ref 250–450)
UIBC: 345 ug/dL (ref 148–442)

## 2023-03-08 NOTE — Progress Notes (Signed)
Hematology and Oncology Follow Up Visit  [name] [id number] 1975-11-09 47 y.o. 03/08/2023   Principle Diagnosis:  Iron deficiency anemia  Beta Thalassemia trait   Current Therapy:        IV iron as indicated Folic acid 1 mg PO daily    Interim History:  [name] is here today for follow-up. She is doing quite well and has no complaints at this time.  Her cycle is on now and she states her flow is lighter but she still notes some clots.  No other blood loss noted.  No bruising or petechiae.  No fever, chills, n/v, cough, rash, dizziness, ice cravings, SOB, chest pain, palpitations, abdominal pain or changes in bowel or bladder habits.  Minimal swelling in the feet noted over the weekend improved with ice cooling and compression socks.  No falls or syncope reported.  Appetite and hydration are good. Weight is stable at 218 lbs.   ECOG Performance Status: 0 - Asymptomatic  Medications:  Allergies as of 03/08/2023       Reactions   Beet [beta Vulgaris]    Amoxicillin Rash        Medication List        Accurate as of March 08, 2023 10:15 AM. If you have any questions, ask your nurse or doctor.          amLODipine 10 MG tablet Commonly known as: NORVASC Take 1 tablet (10 mg total) by mouth daily.   carvedilol 12.5 MG tablet Commonly known as: COREG Take 1 tablet (12.5 mg total) by mouth 2 (two) times daily with a meal.   Cholecalciferol 100 MCG (4000 UT) Tabs Take 4,000 Units by mouth daily.   doxycycline 100 MG tablet Commonly known as: VIBRA-TABS Take 1 tablet (100 mg total) by mouth 2 (two) times daily.   famotidine 40 MG tablet Commonly known as: Pepcid Take 1 tablet (40 mg total) by mouth daily.   fluticasone 50 MCG/ACT nasal spray Commonly known as: FLONASE SHAKE LIQUID AND USE 2 SPRAYS IN EACH NOSTRIL DAILY   folic acid 1 MG tablet Commonly known as: FOLVITE Take 1 tablet (1 mg total) by mouth daily.   hydrOXYzine 25 MG  capsule Commonly known as: VISTARIL Take 1 capsule (25 mg total) by mouth every 8 (eight) hours as needed for itching.   meloxicam 15 MG tablet Commonly known as: MOBIC TAKE 1 TABLET BY MOUTH DAILY AS NEEDED FOR KNEE PAIN   multivitamin tablet Take 1 tablet by mouth daily.   norethindrone 0.35 MG tablet Commonly known as: Ortho Micronor Take 1 tablet (0.35 mg total) by mouth daily.   zinc gluconate 50 MG tablet Take 50 mg by mouth daily.        Allergies:  Allergies  Allergen Reactions   Beet [Beta Vulgaris]    Amoxicillin Rash    Past Medical History, Surgical history, Social history, and Family History were reviewed and updated.  Review of Systems: All other 10 point review of systems is negative.   Physical Exam:  vitals were not taken for this visit.   Wt Readings from Last 3 Encounters:  02/02/23 219 lb (99.3 kg)  12/07/22 220 lb (99.8 kg)  11/02/22 216 lb (98 kg)    Ocular: Sclerae unicteric, pupils equal, round and reactive to light Ear-nose-throat: Oropharynx clear, dentition fair Lymphatic: No cervical or supraclavicular adenopathy Lungs no rales or rhonchi, good excursion bilaterally Heart regular rate and rhythm, no murmur appreciated Abd soft, nontender, positive  bowel sounds MSK no focal spinal tenderness, no joint edema Neuro: non-focal, well-oriented, appropriate affect Breasts: Deferred   Lab Results  Component Value Date   WBC 4.9 02/02/2023   HGB 10.8 (L) 02/02/2023   HCT 35.7 (L) 02/02/2023   MCV 69.5 Repeated and verified X2. (L) 02/02/2023   PLT 236.0 02/02/2023   Lab Results  Component Value Date   FERRITIN 82 12/07/2022   IRON 60 12/07/2022   TIBC 346 12/07/2022   UIBC 286 12/07/2022   IRONPCTSAT 17 12/07/2022   Lab Results  Component Value Date   RETICCTPCT 1.8 12/07/2022   RBC 5.13 (H) 02/02/2023   RETICCTABS 46.10 11/29/2010   No results found for: "KPAFRELGTCHN", "LAMBDASER", "KAPLAMBRATIO" No results found for:  "IGGSERUM", "IGA", "IGMSERUM" No results found for: "TOTALPROTELP", "ALBUMINELP", "A1GS", "A2GS", "BETS", "BETA2SER", "GAMS", "MSPIKE", "SPEI"   Chemistry      Component Value Date/Time   NA 138 02/02/2023 1014   NA 136 07/27/2022 1010   K 3.7 02/02/2023 1014   CL 103 02/02/2023 1014   CO2 27 02/02/2023 1014   BUN 12 02/02/2023 1014   BUN 10 07/27/2022 1010   CREATININE 0.78 02/02/2023 1014   CREATININE 0.81 06/24/2016 1015      Component Value Date/Time   CALCIUM 9.2 02/02/2023 1014   ALKPHOS 42 02/02/2023 1014   AST 11 02/02/2023 1014   ALT 10 02/02/2023 1014   BILITOT 0.5 02/02/2023 1014   BILITOT 0.4 07/27/2022 1010       Impression and Plan: [name] is a very pleasant 47 yo African American female with history of both iron deficiency anemia as well as the beta thalassemia trait.  Iron studies are pending. We will replace if needed.  Continue Folic acid 1 mg PO daily.  Follow-up in 4 months  Eileen Stanford, NP 7/3/202410:15 AM

## 2023-03-17 ENCOUNTER — Inpatient Hospital Stay: Payer: PRIVATE HEALTH INSURANCE

## 2023-03-17 VITALS — BP 133/74 | HR 61 | Temp 97.4°F | Resp 16

## 2023-03-17 DIAGNOSIS — D500 Iron deficiency anemia secondary to blood loss (chronic): Secondary | ICD-10-CM

## 2023-03-17 DIAGNOSIS — D509 Iron deficiency anemia, unspecified: Secondary | ICD-10-CM | POA: Diagnosis not present

## 2023-03-17 MED ORDER — SODIUM CHLORIDE 0.9 % IV SOLN
300.0000 mg | Freq: Once | INTRAVENOUS | Status: AC
  Administered 2023-03-17: 300 mg via INTRAVENOUS
  Filled 2023-03-17: qty 300

## 2023-03-17 MED ORDER — SODIUM CHLORIDE 0.9 % IV SOLN: Freq: Once | INTRAVENOUS | Status: AC

## 2023-03-17 NOTE — Progress Notes (Signed)
Patient refused to wait 30 minutes post infusion. Released stable and ASX. 

## 2023-03-17 NOTE — Patient Instructions (Signed)

## 2023-03-24 ENCOUNTER — Inpatient Hospital Stay: Payer: PRIVATE HEALTH INSURANCE

## 2023-03-24 VITALS — BP 143/72 | HR 82 | Temp 98.5°F | Resp 17

## 2023-03-24 DIAGNOSIS — D500 Iron deficiency anemia secondary to blood loss (chronic): Secondary | ICD-10-CM

## 2023-03-24 DIAGNOSIS — D509 Iron deficiency anemia, unspecified: Secondary | ICD-10-CM | POA: Diagnosis not present

## 2023-03-24 MED ORDER — SODIUM CHLORIDE 0.9 % IV SOLN
300.0000 mg | Freq: Once | INTRAVENOUS | Status: AC
  Administered 2023-03-24: 300 mg via INTRAVENOUS
  Filled 2023-03-24: qty 15

## 2023-03-24 MED ORDER — SODIUM CHLORIDE 0.9 % IV SOLN: Freq: Once | INTRAVENOUS | Status: AC

## 2023-03-24 NOTE — Patient Instructions (Signed)
Iron Sucrose Injection What is this medication? IRON SUCROSE (EYE ern SOO krose) treats low levels of iron (iron deficiency anemia) in people with kidney disease. Iron is a mineral that plays an important role in making red blood cells, which carry oxygen from your lungs to the rest of your body. This medicine may be used for other purposes; ask your health care provider or pharmacist if you have questions. COMMON BRAND NAME(S): Venofer What should I tell my care team before I take this medication? They need to know if you have any of these conditions: Anemia not caused by low iron levels Heart disease High levels of iron in the blood Kidney disease Liver disease An unusual or allergic reaction to iron, other medications, foods, dyes, or preservatives Pregnant or trying to get pregnant Breastfeeding How should I use this medication? This medication is for infusion into a vein. It is given in a hospital or clinic setting. Talk to your care team about the use of this medication in children. While this medication may be prescribed for children as young as 2 years for selected conditions, precautions do apply. Overdosage: If you think you have taken too much of this medicine contact a poison control center or emergency room at once. NOTE: This medicine is only for you. Do not share this medicine with others. What if I miss a dose? Keep appointments for follow-up doses. It is important not to miss your dose. Call your care team if you are unable to keep an appointment. What may interact with this medication? Do not take this medication with any of the following: Deferoxamine Dimercaprol Other iron products This medication may also interact with the following: Chloramphenicol Deferasirox This list may not describe all possible interactions. Give your health care provider a list of all the medicines, herbs, non-prescription drugs, or dietary supplements you use. Also tell them if you smoke,  drink alcohol, or use illegal drugs. Some items may interact with your medicine. What should I watch for while using this medication? Visit your care team regularly. Tell your care team if your symptoms do not start to get better or if they get worse. You may need blood work done while you are taking this medication. You may need to follow a special diet. Talk to your care team. Foods that contain iron include: whole grains/cereals, dried fruits, beans, or peas, leafy green vegetables, and organ meats (liver, kidney). What side effects may I notice from receiving this medication? Side effects that you should report to your care team as soon as possible: Allergic reactions--skin rash, itching, hives, swelling of the face, lips, tongue, or throat Low blood pressure--dizziness, feeling faint or lightheaded, blurry vision Shortness of breath Side effects that usually do not require medical attention (report to your care team if they continue or are bothersome): Flushing Headache Joint pain Muscle pain Nausea Pain, redness, or irritation at injection site This list may not describe all possible side effects. Call your doctor for medical advice about side effects. You may report side effects to FDA at 1-800-FDA-1088. Where should I keep my medication? This medication is given in a hospital or clinic. It will not be stored at home. NOTE: This sheet is a summary. It may not cover all possible information. If you have questions about this medicine, talk to your doctor, pharmacist, or health care provider.  2024 Elsevier/Gold Standard (2023-01-27 00:00:00)

## 2023-03-31 ENCOUNTER — Telehealth: Payer: Self-pay | Admitting: *Deleted

## 2023-03-31 ENCOUNTER — Inpatient Hospital Stay: Payer: PRIVATE HEALTH INSURANCE

## 2023-03-31 VITALS — BP 123/67 | HR 64 | Temp 98.2°F | Resp 18

## 2023-03-31 DIAGNOSIS — D500 Iron deficiency anemia secondary to blood loss (chronic): Secondary | ICD-10-CM

## 2023-03-31 DIAGNOSIS — D509 Iron deficiency anemia, unspecified: Secondary | ICD-10-CM | POA: Diagnosis not present

## 2023-03-31 MED ORDER — SODIUM CHLORIDE 0.9 % IV SOLN
300.0000 mg | Freq: Once | INTRAVENOUS | Status: AC
  Administered 2023-03-31: 300 mg via INTRAVENOUS
  Filled 2023-03-31: qty 300

## 2023-03-31 MED ORDER — SODIUM CHLORIDE 0.9 % IV SOLN: Freq: Once | INTRAVENOUS | Status: AC

## 2023-03-31 NOTE — Patient Instructions (Signed)
Iron Sucrose Injection What is this medication? IRON SUCROSE (EYE ern SOO krose) treats low levels of iron (iron deficiency anemia) in people with kidney disease. Iron is a mineral that plays an important role in making red blood cells, which carry oxygen from your lungs to the rest of your body. This medicine may be used for other purposes; ask your health care provider or pharmacist if you have questions. COMMON BRAND NAME(S): Venofer What should I tell my care team before I take this medication? They need to know if you have any of these conditions: Anemia not caused by low iron levels Heart disease High levels of iron in the blood Kidney disease Liver disease An unusual or allergic reaction to iron, other medications, foods, dyes, or preservatives Pregnant or trying to get pregnant Breastfeeding How should I use this medication? This medication is for infusion into a vein. It is given in a hospital or clinic setting. Talk to your care team about the use of this medication in children. While this medication may be prescribed for children as young as 2 years for selected conditions, precautions do apply. Overdosage: If you think you have taken too much of this medicine contact a poison control center or emergency room at once. NOTE: This medicine is only for you. Do not share this medicine with others. What if I miss a dose? Keep appointments for follow-up doses. It is important not to miss your dose. Call your care team if you are unable to keep an appointment. What may interact with this medication? Do not take this medication with any of the following: Deferoxamine Dimercaprol Other iron products This medication may also interact with the following: Chloramphenicol Deferasirox This list may not describe all possible interactions. Give your health care provider a list of all the medicines, herbs, non-prescription drugs, or dietary supplements you use. Also tell them if you smoke,  drink alcohol, or use illegal drugs. Some items may interact with your medicine. What should I watch for while using this medication? Visit your care team regularly. Tell your care team if your symptoms do not start to get better or if they get worse. You may need blood work done while you are taking this medication. You may need to follow a special diet. Talk to your care team. Foods that contain iron include: whole grains/cereals, dried fruits, beans, or peas, leafy green vegetables, and organ meats (liver, kidney). What side effects may I notice from receiving this medication? Side effects that you should report to your care team as soon as possible: Allergic reactions--skin rash, itching, hives, swelling of the face, lips, tongue, or throat Low blood pressure--dizziness, feeling faint or lightheaded, blurry vision Shortness of breath Side effects that usually do not require medical attention (report to your care team if they continue or are bothersome): Flushing Headache Joint pain Muscle pain Nausea Pain, redness, or irritation at injection site This list may not describe all possible side effects. Call your doctor for medical advice about side effects. You may report side effects to FDA at 1-800-FDA-1088. Where should I keep my medication? This medication is given in a hospital or clinic. It will not be stored at home. NOTE: This sheet is a summary. It may not cover all possible information. If you have questions about this medicine, talk to your doctor, pharmacist, or health care provider.  2024 Elsevier/Gold Standard (2023-01-27 00:00:00)

## 2023-03-31 NOTE — Telephone Encounter (Signed)
Per scheduling message [place] - Called patient and lvm of new appointments , requested call back to confirm.

## 2023-04-24 ENCOUNTER — Other Ambulatory Visit: Payer: Self-pay | Admitting: *Deleted

## 2023-04-24 DIAGNOSIS — I10 Essential (primary) hypertension: Secondary | ICD-10-CM

## 2023-04-24 MED ORDER — AMLODIPINE BESYLATE 5 MG PO TABS
5.0000 mg | ORAL_TABLET | Freq: Two times a day (BID) | ORAL | 0 refills | Status: DC
Start: 2023-04-24 — End: 2023-08-02

## 2023-07-10 ENCOUNTER — Encounter: Payer: Self-pay | Admitting: Medical Oncology

## 2023-07-10 ENCOUNTER — Inpatient Hospital Stay: Payer: Medicaid Other | Attending: Hematology & Oncology

## 2023-07-10 ENCOUNTER — Encounter: Payer: Self-pay | Admitting: Family

## 2023-07-10 ENCOUNTER — Inpatient Hospital Stay (HOSPITAL_BASED_OUTPATIENT_CLINIC_OR_DEPARTMENT_OTHER): Payer: Medicaid Other | Admitting: Medical Oncology

## 2023-07-10 ENCOUNTER — Other Ambulatory Visit: Payer: Self-pay

## 2023-07-10 VITALS — BP 144/89 | HR 62 | Temp 97.9°F | Resp 18 | Ht 65.0 in | Wt 226.1 lb

## 2023-07-10 DIAGNOSIS — D500 Iron deficiency anemia secondary to blood loss (chronic): Secondary | ICD-10-CM

## 2023-07-10 DIAGNOSIS — N920 Excessive and frequent menstruation with regular cycle: Secondary | ICD-10-CM | POA: Diagnosis present

## 2023-07-10 DIAGNOSIS — D563 Thalassemia minor: Secondary | ICD-10-CM

## 2023-07-10 LAB — RETICULOCYTES
Immature Retic Fract: 25.2 % — ABNORMAL HIGH (ref 2.3–15.9)
RBC.: 5.01 MIL/uL (ref 3.87–5.11)
Retic Count, Absolute: 68.1 10*3/uL (ref 19.0–186.0)
Retic Ct Pct: 1.4 % (ref 0.4–3.1)

## 2023-07-10 LAB — IRON AND IRON BINDING CAPACITY (CC-WL,HP ONLY)
Iron: 45 ug/dL (ref 28–170)
Saturation Ratios: 12 % (ref 10.4–31.8)
TIBC: 370 ug/dL (ref 250–450)
UIBC: 325 ug/dL (ref 148–442)

## 2023-07-10 LAB — CBC WITH DIFFERENTIAL (CANCER CENTER ONLY)
Abs Immature Granulocytes: 0.02 10*3/uL (ref 0.00–0.07)
Basophils Absolute: 0 10*3/uL (ref 0.0–0.1)
Basophils Relative: 1 %
Eosinophils Absolute: 0.2 10*3/uL (ref 0.0–0.5)
Eosinophils Relative: 4 %
HCT: 34.9 % — ABNORMAL LOW (ref 36.0–46.0)
Hemoglobin: 11 g/dL — ABNORMAL LOW (ref 12.0–15.0)
Immature Granulocytes: 1 %
Lymphocytes Relative: 45 %
Lymphs Abs: 2 10*3/uL (ref 0.7–4.0)
MCH: 21.9 pg — ABNORMAL LOW (ref 26.0–34.0)
MCHC: 31.5 g/dL (ref 30.0–36.0)
MCV: 69.4 fL — ABNORMAL LOW (ref 80.0–100.0)
Monocytes Absolute: 0.3 10*3/uL (ref 0.1–1.0)
Monocytes Relative: 7 %
Neutro Abs: 1.9 10*3/uL (ref 1.7–7.7)
Neutrophils Relative %: 42 %
Platelet Count: 216 10*3/uL (ref 150–400)
RBC: 5.03 MIL/uL (ref 3.87–5.11)
RDW: 16.5 % — ABNORMAL HIGH (ref 11.5–15.5)
WBC Count: 4.4 10*3/uL (ref 4.0–10.5)
nRBC: 0 % (ref 0.0–0.2)

## 2023-07-10 LAB — FERRITIN: Ferritin: 23 ng/mL (ref 11–307)

## 2023-07-10 NOTE — Progress Notes (Signed)
Hematology and Oncology Follow Up Visit  [name] [id number] 1975-09-19 47 y.o. 07/10/2023   Principle Diagnosis:  Iron deficiency anemia - heavy menses  Beta Thalassemia trait   Current Therapy:        IV iron as indicated- Venofer- last dose 03/31/2023 Folic acid 1 mg PO daily    Interim History:  [name] is here today for follow-up.   She reports that her menstrual cycles has been heavy for the past month. She is working with Dr. Patsy Lager on this.  Fatigue, palpitations, SOB, occasional dizziness.  She is not taking any iron supplement. She is taking her folic acid.  No other blood loss noted.  No bruising or petechiae.  No fever, chills, n/v, cough, rash, chest pain,  abdominal pain or changes in bowel or bladder habits.  No falls or syncope reported.  Appetite and hydration are good.   ECOG Performance Status: 0 - Asymptomatic  Medications:  Allergies as of 07/10/2023       Reactions   Beet [beta Vulgaris] Hives, Itching, Rash   Fluid filled blisters   Amoxicillin Rash        Medication List        Accurate as of July 10, 2023  1:03 PM. If you have any questions, ask your nurse or doctor.          amLODipine 5 MG tablet Commonly known as: NORVASC Take 1 tablet (5 mg total) by mouth in the morning and at bedtime. 1st attempt. Pt needs appointment in November for future refills.   carvedilol 12.5 MG tablet Commonly known as: COREG Take 1 tablet (12.5 mg total) by mouth 2 (two) times daily with a meal.   Cholecalciferol 100 MCG (4000 UT) Tabs Take 4,000 Units by mouth daily.   famotidine 40 MG tablet Commonly known as: Pepcid Take 1 tablet (40 mg total) by mouth daily.   fluticasone 50 MCG/ACT nasal spray Commonly known as: FLONASE SHAKE LIQUID AND USE 2 SPRAYS IN EACH NOSTRIL DAILY   folic acid 1 MG tablet Commonly known as: FOLVITE Take 1 tablet (1 mg total) by mouth daily.   hydrOXYzine 25 MG capsule Commonly known as:  VISTARIL Take 1 capsule (25 mg total) by mouth every 8 (eight) hours as needed for itching.   meloxicam 15 MG tablet Commonly known as: MOBIC TAKE 1 TABLET BY MOUTH DAILY AS NEEDED FOR KNEE PAIN   multivitamin tablet Take 1 tablet by mouth daily.   norethindrone 0.35 MG tablet Commonly known as: Ortho Micronor Take 1 tablet (0.35 mg total) by mouth daily.   zinc gluconate 50 MG tablet Take 50 mg by mouth daily.        Allergies:  Allergies  Allergen Reactions   Beet [Beta Vulgaris] Hives, Itching and Rash    Fluid filled blisters   Amoxicillin Rash    Past Medical History, Surgical history, Social history, and Family History were reviewed and updated.  Review of Systems: All other 10 point review of systems is negative.   Physical Exam:  height is 5\' 5"  (1.651 m) and weight is 226 lb 1.3 oz (102.5 kg). Her oral temperature is 97.9 F (36.6 C). Her blood pressure is 144/89 (abnormal) and her pulse is 62. Her respiration is 18 and oxygen saturation is 100%.   Wt Readings from Last 3 Encounters:  07/10/23 226 lb 1.3 oz (102.5 kg)  03/08/23 218 lb 1.9 oz (98.9 kg)  02/02/23 219 lb (99.3 kg)  Ocular: Sclerae unicteric, pupils equal, round and reactive to light Ear-nose-throat: Oropharynx clear, dentition fair Lymphatic: No cervical or supraclavicular adenopathy Lungs no rales or rhonchi, good excursion bilaterally Heart regular rate and rhythm, no murmur appreciated Abd soft, nontender, positive bowel sounds MSK no focal spinal tenderness, no joint edema Neuro: non-focal, well-oriented, appropriate affect   Lab Results  Component Value Date   WBC 4.4 07/10/2023   HGB 11.0 (L) 07/10/2023   HCT 34.9 (L) 07/10/2023   MCV 69.4 (L) 07/10/2023   PLT 216 07/10/2023   Lab Results  Component Value Date   FERRITIN 19 03/08/2023   IRON 33 03/08/2023   TIBC 378 03/08/2023   UIBC 345 03/08/2023   IRONPCTSAT 9 (L) 03/08/2023   Lab Results  Component Value Date    RETICCTPCT 1.4 07/10/2023   RBC 5.01 07/10/2023   RBC 5.03 07/10/2023   RETICCTABS 46.10 11/29/2010   No results found for: "KPAFRELGTCHN", "LAMBDASER", "KAPLAMBRATIO" No results found for: "IGGSERUM", "IGA", "IGMSERUM" No results found for: "TOTALPROTELP", "ALBUMINELP", "A1GS", "A2GS", "BETS", "BETA2SER", "GAMS", "MSPIKE", "SPEI"   Chemistry      Component Value Date/Time   NA 138 02/02/2023 1014   NA 136 07/27/2022 1010   K 3.7 02/02/2023 1014   CL 103 02/02/2023 1014   CO2 27 02/02/2023 1014   BUN 12 02/02/2023 1014   BUN 10 07/27/2022 1010   CREATININE 0.78 02/02/2023 1014   CREATININE 0.81 06/24/2016 1015      Component Value Date/Time   CALCIUM 9.2 02/02/2023 1014   ALKPHOS 42 02/02/2023 1014   AST 11 02/02/2023 1014   ALT 10 02/02/2023 1014   BILITOT 0.5 02/02/2023 1014   BILITOT 0.4 07/27/2022 1010      Encounter Diagnoses  Name Primary?   Iron deficiency anemia due to chronic blood loss Yes   Beta thalassemia trait     Impression and Plan: [name] is a very pleasant 47 yo [place] American female with history of both iron deficiency anemia as well as the beta thalassemia trait.   Hgb is 11. Microcytosis. Elevated immature retic. Will plan for IV iron Continue Folic acid 1 mg PO daily.  RTC 2 months APP, labs (CBC, iron, ferritin)  Rushie Chestnut, PA-C 11/4/20241:03 PM

## 2023-07-14 ENCOUNTER — Encounter: Payer: Self-pay | Admitting: Family Medicine

## 2023-07-14 ENCOUNTER — Inpatient Hospital Stay: Payer: Medicaid Other

## 2023-07-14 VITALS — BP 137/83 | HR 72 | Temp 98.7°F | Resp 18

## 2023-07-14 DIAGNOSIS — N926 Irregular menstruation, unspecified: Secondary | ICD-10-CM

## 2023-07-14 DIAGNOSIS — D500 Iron deficiency anemia secondary to blood loss (chronic): Secondary | ICD-10-CM

## 2023-07-14 MED ORDER — SODIUM CHLORIDE 0.9 % IV SOLN: Freq: Once | INTRAVENOUS | Status: AC

## 2023-07-14 MED ORDER — SODIUM CHLORIDE 0.9 % IV SOLN
300.0000 mg | Freq: Once | INTRAVENOUS | Status: AC
  Administered 2023-07-14: 300 mg via INTRAVENOUS
  Filled 2023-07-14: qty 300

## 2023-07-14 NOTE — Patient Instructions (Signed)
Iron Sucrose Injection What is this medication? IRON SUCROSE (EYE ern SOO krose) treats low levels of iron (iron deficiency anemia) in people with kidney disease. Iron is a mineral that plays an important role in making red blood cells, which carry oxygen from your lungs to the rest of your body. This medicine may be used for other purposes; ask your health care provider or pharmacist if you have questions. COMMON BRAND NAME(S): Venofer What should I tell my care team before I take this medication? They need to know if you have any of these conditions: Anemia not caused by low iron levels Heart disease High levels of iron in the blood Kidney disease Liver disease An unusual or allergic reaction to iron, other medications, foods, dyes, or preservatives Pregnant or trying to get pregnant Breastfeeding How should I use this medication? This medication is for infusion into a vein. It is given in a hospital or clinic setting. Talk to your care team about the use of this medication in children. While this medication may be prescribed for children as young as 2 years for selected conditions, precautions do apply. Overdosage: If you think you have taken too much of this medicine contact a poison control center or emergency room at once. NOTE: This medicine is only for you. Do not share this medicine with others. What if I miss a dose? Keep appointments for follow-up doses. It is important not to miss your dose. Call your care team if you are unable to keep an appointment. What may interact with this medication? Do not take this medication with any of the following: Deferoxamine Dimercaprol Other iron products This medication may also interact with the following: Chloramphenicol Deferasirox This list may not describe all possible interactions. Give your health care provider a list of all the medicines, herbs, non-prescription drugs, or dietary supplements you use. Also tell them if you smoke,  drink alcohol, or use illegal drugs. Some items may interact with your medicine. What should I watch for while using this medication? Visit your care team regularly. Tell your care team if your symptoms do not start to get better or if they get worse. You may need blood work done while you are taking this medication. You may need to follow a special diet. Talk to your care team. Foods that contain iron include: whole grains/cereals, dried fruits, beans, or peas, leafy green vegetables, and organ meats (liver, kidney). What side effects may I notice from receiving this medication? Side effects that you should report to your care team as soon as possible: Allergic reactions--skin rash, itching, hives, swelling of the face, lips, tongue, or throat Low blood pressure--dizziness, feeling faint or lightheaded, blurry vision Shortness of breath Side effects that usually do not require medical attention (report to your care team if they continue or are bothersome): Flushing Headache Joint pain Muscle pain Nausea Pain, redness, or irritation at injection site This list may not describe all possible side effects. Call your doctor for medical advice about side effects. You may report side effects to FDA at 1-800-FDA-1088. Where should I keep my medication? This medication is given in a hospital or clinic. It will not be stored at home. NOTE: This sheet is a summary. It may not cover all possible information. If you have questions about this medicine, talk to your doctor, pharmacist, or health care provider.  2024 Elsevier/Gold Standard (2023-01-27 00:00:00)

## 2023-07-21 ENCOUNTER — Inpatient Hospital Stay: Payer: Medicaid Other

## 2023-07-21 VITALS — BP 115/71 | HR 71 | Temp 98.3°F | Resp 18

## 2023-07-21 DIAGNOSIS — D500 Iron deficiency anemia secondary to blood loss (chronic): Secondary | ICD-10-CM

## 2023-07-21 MED ORDER — SODIUM CHLORIDE 0.9 % IV SOLN
300.0000 mg | Freq: Once | INTRAVENOUS | Status: AC
  Administered 2023-07-21: 300 mg via INTRAVENOUS
  Filled 2023-07-21: qty 300

## 2023-07-21 MED ORDER — SODIUM CHLORIDE 0.9 % IV SOLN: Freq: Once | INTRAVENOUS | Status: AC

## 2023-07-21 NOTE — Patient Instructions (Signed)
Iron Sucrose Injection What is this medication? IRON SUCROSE (EYE ern SOO krose) treats low levels of iron (iron deficiency anemia) in people with kidney disease. Iron is a mineral that plays an important role in making red blood cells, which carry oxygen from your lungs to the rest of your body. This medicine may be used for other purposes; ask your health care provider or pharmacist if you have questions. COMMON BRAND NAME(S): Venofer What should I tell my care team before I take this medication? They need to know if you have any of these conditions: Anemia not caused by low iron levels Heart disease High levels of iron in the blood Kidney disease Liver disease An unusual or allergic reaction to iron, other medications, foods, dyes, or preservatives Pregnant or trying to get pregnant Breastfeeding How should I use this medication? This medication is for infusion into a vein. It is given in a hospital or clinic setting. Talk to your care team about the use of this medication in children. While this medication may be prescribed for children as young as 2 years for selected conditions, precautions do apply. Overdosage: If you think you have taken too much of this medicine contact a poison control center or emergency room at once. NOTE: This medicine is only for you. Do not share this medicine with others. What if I miss a dose? Keep appointments for follow-up doses. It is important not to miss your dose. Call your care team if you are unable to keep an appointment. What may interact with this medication? Do not take this medication with any of the following: Deferoxamine Dimercaprol Other iron products This medication may also interact with the following: Chloramphenicol Deferasirox This list may not describe all possible interactions. Give your health care provider a list of all the medicines, herbs, non-prescription drugs, or dietary supplements you use. Also tell them if you smoke,  drink alcohol, or use illegal drugs. Some items may interact with your medicine. What should I watch for while using this medication? Visit your care team regularly. Tell your care team if your symptoms do not start to get better or if they get worse. You may need blood work done while you are taking this medication. You may need to follow a special diet. Talk to your care team. Foods that contain iron include: whole grains/cereals, dried fruits, beans, or peas, leafy green vegetables, and organ meats (liver, kidney). What side effects may I notice from receiving this medication? Side effects that you should report to your care team as soon as possible: Allergic reactions--skin rash, itching, hives, swelling of the face, lips, tongue, or throat Low blood pressure--dizziness, feeling faint or lightheaded, blurry vision Shortness of breath Side effects that usually do not require medical attention (report to your care team if they continue or are bothersome): Flushing Headache Joint pain Muscle pain Nausea Pain, redness, or irritation at injection site This list may not describe all possible side effects. Call your doctor for medical advice about side effects. You may report side effects to FDA at 1-800-FDA-1088. Where should I keep my medication? This medication is given in a hospital or clinic. It will not be stored at home. NOTE: This sheet is a summary. It may not cover all possible information. If you have questions about this medicine, talk to your doctor, pharmacist, or health care provider.  2024 Elsevier/Gold Standard (2023-01-27 00:00:00)

## 2023-07-21 NOTE — Progress Notes (Signed)
Patient refused to wait 30 minutes post infusion. Released stable and ASX. 

## 2023-07-28 ENCOUNTER — Inpatient Hospital Stay: Payer: Medicaid Other

## 2023-08-01 ENCOUNTER — Inpatient Hospital Stay: Payer: Medicaid Other

## 2023-08-01 VITALS — BP 136/82 | HR 72 | Temp 98.8°F | Resp 18

## 2023-08-01 DIAGNOSIS — D500 Iron deficiency anemia secondary to blood loss (chronic): Secondary | ICD-10-CM

## 2023-08-01 MED ORDER — SODIUM CHLORIDE 0.9 % IV SOLN: Freq: Once | INTRAVENOUS | Status: AC

## 2023-08-01 MED ORDER — SODIUM CHLORIDE 0.9 % IV SOLN
300.0000 mg | Freq: Once | INTRAVENOUS | Status: AC
  Administered 2023-08-01: 300 mg via INTRAVENOUS
  Filled 2023-08-01: qty 300

## 2023-08-01 NOTE — Patient Instructions (Signed)
Iron Sucrose Injection What is this medication? IRON SUCROSE (EYE ern SOO krose) treats low levels of iron (iron deficiency anemia) in people with kidney disease. Iron is a mineral that plays an important role in making red blood cells, which carry oxygen from your lungs to the rest of your body. This medicine may be used for other purposes; ask your health care provider or pharmacist if you have questions. COMMON BRAND NAME(S): Venofer What should I tell my care team before I take this medication? They need to know if you have any of these conditions: Anemia not caused by low iron levels Heart disease High levels of iron in the blood Kidney disease Liver disease An unusual or allergic reaction to iron, other medications, foods, dyes, or preservatives Pregnant or trying to get pregnant Breastfeeding How should I use this medication? This medication is for infusion into a vein. It is given in a hospital or clinic setting. Talk to your care team about the use of this medication in children. While this medication may be prescribed for children as young as 2 years for selected conditions, precautions do apply. Overdosage: If you think you have taken too much of this medicine contact a poison control center or emergency room at once. NOTE: This medicine is only for you. Do not share this medicine with others. What if I miss a dose? Keep appointments for follow-up doses. It is important not to miss your dose. Call your care team if you are unable to keep an appointment. What may interact with this medication? Do not take this medication with any of the following: Deferoxamine Dimercaprol Other iron products This medication may also interact with the following: Chloramphenicol Deferasirox This list may not describe all possible interactions. Give your health care provider a list of all the medicines, herbs, non-prescription drugs, or dietary supplements you use. Also tell them if you smoke,  drink alcohol, or use illegal drugs. Some items may interact with your medicine. What should I watch for while using this medication? Visit your care team regularly. Tell your care team if your symptoms do not start to get better or if they get worse. You may need blood work done while you are taking this medication. You may need to follow a special diet. Talk to your care team. Foods that contain iron include: whole grains/cereals, dried fruits, beans, or peas, leafy green vegetables, and organ meats (liver, kidney). What side effects may I notice from receiving this medication? Side effects that you should report to your care team as soon as possible: Allergic reactions--skin rash, itching, hives, swelling of the face, lips, tongue, or throat Low blood pressure--dizziness, feeling faint or lightheaded, blurry vision Shortness of breath Side effects that usually do not require medical attention (report to your care team if they continue or are bothersome): Flushing Headache Joint pain Muscle pain Nausea Pain, redness, or irritation at injection site This list may not describe all possible side effects. Call your doctor for medical advice about side effects. You may report side effects to FDA at 1-800-FDA-1088. Where should I keep my medication? This medication is given in a hospital or clinic. It will not be stored at home. NOTE: This sheet is a summary. It may not cover all possible information. If you have questions about this medicine, talk to your doctor, pharmacist, or health care provider.  2024 Elsevier/Gold Standard (2023-01-27 00:00:00)

## 2023-08-02 ENCOUNTER — Other Ambulatory Visit: Payer: Self-pay

## 2023-08-02 ENCOUNTER — Other Ambulatory Visit: Payer: Self-pay | Admitting: Internal Medicine

## 2023-08-02 DIAGNOSIS — D563 Thalassemia minor: Secondary | ICD-10-CM

## 2023-08-02 DIAGNOSIS — I10 Essential (primary) hypertension: Secondary | ICD-10-CM

## 2023-08-02 MED ORDER — CARVEDILOL 12.5 MG PO TABS: 12.5000 mg | ORAL_TABLET | Freq: Two times a day (BID) | ORAL | 0 refills | Status: DC

## 2023-08-02 MED ORDER — FOLIC ACID 1 MG PO TABS: 1.0000 mg | ORAL_TABLET | Freq: Every day | ORAL | 11 refills | Status: AC

## 2023-08-09 ENCOUNTER — Encounter: Payer: Self-pay | Admitting: Family

## 2023-09-11 ENCOUNTER — Other Ambulatory Visit: Payer: Medicaid Other

## 2023-09-11 ENCOUNTER — Ambulatory Visit: Payer: Medicaid Other | Admitting: Medical Oncology

## 2023-09-11 ENCOUNTER — Telehealth: Payer: Self-pay | Admitting: Medical Oncology

## 2023-09-11 NOTE — Telephone Encounter (Signed)
 sarah in late, left detailed voicemail for patient regarding sarah coming in late today and need to push appt to later today or another day

## 2023-09-12 ENCOUNTER — Other Ambulatory Visit: Payer: Self-pay | Admitting: Medical Oncology

## 2023-09-12 ENCOUNTER — Inpatient Hospital Stay: Payer: Medicaid Other | Admitting: Medical Oncology

## 2023-09-12 ENCOUNTER — Encounter: Payer: Self-pay | Admitting: Medical Oncology

## 2023-09-12 ENCOUNTER — Inpatient Hospital Stay: Payer: Medicaid Other | Attending: Hematology & Oncology

## 2023-09-12 VITALS — BP 144/84 | HR 65 | Temp 98.9°F | Resp 18 | Ht 65.0 in | Wt 230.4 lb

## 2023-09-12 DIAGNOSIS — N920 Excessive and frequent menstruation with regular cycle: Secondary | ICD-10-CM | POA: Diagnosis not present

## 2023-09-12 DIAGNOSIS — D500 Iron deficiency anemia secondary to blood loss (chronic): Secondary | ICD-10-CM | POA: Diagnosis not present

## 2023-09-12 DIAGNOSIS — D563 Thalassemia minor: Secondary | ICD-10-CM

## 2023-09-12 DIAGNOSIS — R5383 Other fatigue: Secondary | ICD-10-CM | POA: Insufficient documentation

## 2023-09-12 LAB — CBC
HCT: 35.8 % — ABNORMAL LOW (ref 36.0–46.0)
Hemoglobin: 11.1 g/dL — ABNORMAL LOW (ref 12.0–15.0)
MCH: 21.9 pg — ABNORMAL LOW (ref 26.0–34.0)
MCHC: 31 g/dL (ref 30.0–36.0)
MCV: 70.8 fL — ABNORMAL LOW (ref 80.0–100.0)
Platelets: 239 10*3/uL (ref 150–400)
RBC: 5.06 MIL/uL (ref 3.87–5.11)
RDW: 17.7 % — ABNORMAL HIGH (ref 11.5–15.5)
WBC: 4.9 10*3/uL (ref 4.0–10.5)
nRBC: 0 % (ref 0.0–0.2)

## 2023-09-12 LAB — RETIC PANEL
Immature Retic Fract: 22.2 % — ABNORMAL HIGH (ref 2.3–15.9)
RBC.: 4.95 MIL/uL (ref 3.87–5.11)
Retic Count, Absolute: 68.8 10*3/uL (ref 19.0–186.0)
Retic Ct Pct: 1.4 % (ref 0.4–3.1)
Reticulocyte Hemoglobin: 24.7 pg — ABNORMAL LOW (ref >27.9)

## 2023-09-12 LAB — FERRITIN: Ferritin: 47 ng/mL (ref 11–307)

## 2023-09-12 NOTE — Progress Notes (Signed)
 Hematology and Oncology Follow Up Visit  [name] [id number] [birth year] 48 y.o. 09/12/2023   Principle Diagnosis:  Iron  deficiency anemia - heavy menses  Beta Thalassemia trait   Current Therapy:        IV iron  as indicated- Venofer - last dose 03/31/2023 Folic acid  1 mg PO daily    Interim History:  [name] is here today for follow-up.   She reports that she is tired. Menstrual cycles has lasted about 1 month long.  She was working with Dr. Watt on this but has now been referred to GYN. She has an appointment with them for consult in a few weeks.  Fatigue is still present, palpitations have resolved, SOB is still here when she exercises occasional dizziness.  She is not taking any iron  supplement. She is taking her folic acid .  No other blood loss noted.  No bruising or petechiae.  No fever, chills, n/v, cough, rash, chest pain,  abdominal pain or changes in bowel or bladder habits.  No falls or syncope reported.  Appetite and hydration are good.   ECOG Performance Status: 0 - Asymptomatic  Medications:  Allergies as of 09/12/2023       Reactions   Beet [beta Vulgaris] Hives, Itching, Rash   Fluid filled blisters   Amoxicillin Rash        Medication List        Accurate as of September 12, 2023  3:08 PM. If you have any questions, ask your nurse or doctor.          amLODipine  5 MG tablet Commonly known as: NORVASC  TAKE 1 TABLET BY MOUTH IN THE MORNING AND AT BEDTIME   carvedilol  12.5 MG tablet Commonly known as: COREG  Take 1 tablet (12.5 mg total) by mouth 2 (two) times daily with a meal.   Cholecalciferol  100 MCG (4000 UT) Tabs Take 4,000 Units by mouth daily.   famotidine  40 MG tablet Commonly known as: Pepcid  Take 1 tablet (40 mg total) by mouth daily.   fluticasone  50 MCG/ACT nasal spray Commonly known as: FLONASE  SHAKE LIQUID AND USE 2 SPRAYS IN EACH NOSTRIL DAILY   folic acid  1 MG tablet Commonly known as: FOLVITE  Take 1 tablet  (1 mg total) by mouth daily.   hydrOXYzine  25 MG capsule Commonly known as: VISTARIL  Take 1 capsule (25 mg total) by mouth every 8 (eight) hours as needed for itching.   meloxicam  15 MG tablet Commonly known as: MOBIC  TAKE 1 TABLET BY MOUTH DAILY AS NEEDED FOR KNEE PAIN   multivitamin tablet Take 1 tablet by mouth daily.   norethindrone  0.35 MG tablet Commonly known as: Ortho Micronor  Take 1 tablet (0.35 mg total) by mouth daily.   zinc gluconate 50 MG tablet Take 50 mg by mouth daily.        Allergies:  Allergies  Allergen Reactions   Beet [Beta Vulgaris] Hives, Itching and Rash    Fluid filled blisters   Amoxicillin Rash    Past Medical History, Surgical history, Social history, and Family History were reviewed and updated.  Review of Systems: All other 10 point review of systems is negative.   Physical Exam:  height is 5' 5 (1.651 m) and weight is 230 lb 6.4 oz (104.5 kg). Her oral temperature is 98.9 F (37.2 C). Her blood pressure is 144/84 (abnormal) and her pulse is 65. Her respiration is 18 and oxygen saturation is 100%.   Wt Readings from Last 3 Encounters:  09/12/23 230 lb 6.4  oz (104.5 kg)  07/10/23 226 lb 1.3 oz (102.5 kg)  03/08/23 218 lb 1.9 oz (98.9 kg)    Ocular: Sclerae unicteric, pupils equal, round and reactive to light Ear-nose-throat: Oropharynx clear, dentition fair Lymphatic: No cervical or supraclavicular adenopathy Lungs no rales or rhonchi, good excursion bilaterally Heart regular rate and rhythm, no murmur appreciated Abd soft, nontender, positive bowel sounds MSK no focal spinal tenderness, no joint edema Neuro: non-focal, well-oriented, appropriate affect   Lab Results  Component Value Date   WBC 4.9 09/12/2023   HGB 11.1 (L) 09/12/2023   HCT 35.8 (L) 09/12/2023   MCV 70.8 (L) 09/12/2023   PLT 239 09/12/2023   Lab Results  Component Value Date   FERRITIN 23 07/10/2023   IRON  45 07/10/2023   TIBC 370 07/10/2023   UIBC  325 07/10/2023   IRONPCTSAT 12 07/10/2023   Lab Results  Component Value Date   RETICCTPCT 1.4 09/12/2023   RBC 4.95 09/12/2023   RETICCTABS 46.10 11/29/2010   No results found for: KPAFRELGTCHN, LAMBDASER, KAPLAMBRATIO No results found for: IGGSERUM, IGA, IGMSERUM No results found for: STEPHANY CARLOTA BENSON MARKEL EARLA JOANNIE DOC VICK, SPEI   Chemistry      Component Value Date/Time   NA 138 02/02/2023 1014   NA 136 07/27/2022 1010   K 3.7 02/02/2023 1014   CL 103 02/02/2023 1014   CO2 27 02/02/2023 1014   BUN 12 02/02/2023 1014   BUN 10 07/27/2022 1010   CREATININE 0.78 02/02/2023 1014   CREATININE 0.81 06/24/2016 1015      Component Value Date/Time   CALCIUM  9.2 02/02/2023 1014   ALKPHOS 42 02/02/2023 1014   AST 11 02/02/2023 1014   ALT 10 02/02/2023 1014   BILITOT 0.5 02/02/2023 1014   BILITOT 0.4 07/27/2022 1010      Encounter Diagnoses  Name Primary?   Iron  deficiency anemia due to chronic blood loss Yes   Beta thalassemia trait     Impression and Plan: [name] is a very pleasant 48 yo African American female with history of both iron  deficiency anemia secondary to heavy menses as well as the beta thalassemia trait.   Hgb is 11.1 which is stable. Still has microcytosis though a bit improved from previous. She continues to have an elevated immature retic.  Iron  studies pending. Will plan for IV iron  if low She will continue working with OB-GYN.   Continue Folic acid  1 mg PO daily.  RTC 2 months APP, labs (CBC, iron , ferritin)  Lauraine CHRISTELLA Dais, PA-C 1/7/20253:08 PM

## 2023-09-13 ENCOUNTER — Inpatient Hospital Stay: Payer: Medicaid Other

## 2023-09-13 ENCOUNTER — Inpatient Hospital Stay: Payer: Medicaid Other | Admitting: Medical Oncology

## 2023-09-13 DIAGNOSIS — Z0120 Encounter for dental examination and cleaning without abnormal findings: Secondary | ICD-10-CM | POA: Diagnosis not present

## 2023-09-13 LAB — IRON AND IRON BINDING CAPACITY (CC-WL,HP ONLY)
Iron: 53 ug/dL (ref 28–170)
Saturation Ratios: 14 % (ref 10.4–31.8)
TIBC: 374 ug/dL (ref 250–450)
UIBC: 321 ug/dL (ref 148–442)

## 2023-09-18 ENCOUNTER — Other Ambulatory Visit: Payer: Self-pay | Admitting: Medical Oncology

## 2023-09-26 ENCOUNTER — Ambulatory Visit (INDEPENDENT_AMBULATORY_CARE_PROVIDER_SITE_OTHER): Payer: Medicaid Other | Admitting: Family Medicine

## 2023-09-26 ENCOUNTER — Other Ambulatory Visit (HOSPITAL_COMMUNITY)
Admission: RE | Admit: 2023-09-26 | Discharge: 2023-09-26 | Disposition: A | Discharge status: Home / Self Care | Payer: Medicaid Other | Source: Ambulatory Visit | Attending: Family Medicine | Admitting: Family Medicine

## 2023-09-26 ENCOUNTER — Other Ambulatory Visit: Payer: Self-pay

## 2023-09-26 ENCOUNTER — Encounter: Payer: Self-pay | Admitting: Family Medicine

## 2023-09-26 VITALS — BP 138/87 | HR 72

## 2023-09-26 DIAGNOSIS — Z1331 Encounter for screening for depression: Secondary | ICD-10-CM

## 2023-09-26 DIAGNOSIS — N938 Other specified abnormal uterine and vaginal bleeding: Secondary | ICD-10-CM | POA: Diagnosis not present

## 2023-09-26 DIAGNOSIS — Z1231 Encounter for screening mammogram for malignant neoplasm of breast: Secondary | ICD-10-CM

## 2023-09-26 MED ORDER — MEDROXYPROGESTERONE ACETATE 10 MG PO TABS: 10.0000 mg | ORAL_TABLET | Freq: Every day | ORAL | 1 refills | Status: DC

## 2023-09-26 NOTE — Progress Notes (Signed)
    GYNECOLOGY CLINIC ENDOMETRIAL BIOPSY PROCEDURE NOTE  [name] is a y 48 y.o. [phone] here for endometrial biopsy for abnormal uterine bleeding.  Discussed nature of the procedure and risks and benefits.  Pregnancy test: Patient's last menstrual period was 09/08/2023. No sexual activity in past two months.   Allergies  Allergen Reactions   Beet [Beta Vulgaris] Hives, Itching and Rash    Fluid filled blisters   Amoxicillin Rash    Patient given informed consent, signed copy in the chart, time out was performed.    The patient was placed in the lithotomy position and the cervix brought into view with sterile speculum.  Cervix cleansed x 2 with betadine swabs. A tenaculum was placed in the anterior lip of the cervix. The uterus was sounded for depth of 9-10 cm. A pipelle was introduced to into the uterus, suction created,  and an endometrial sample was obtained. All equipment was removed and accounted for.  The patient tolerated the procedure well.   Patient given post procedure instructions.  Per patient preference she will be notified of results by MyChart.  Venora Maples, MD/MPH Attending Family Medicine Physician, [facility] for [facility], [facility]

## 2023-09-26 NOTE — Assessment & Plan Note (Signed)
Discussed workup to include EMB which was perofrmed today without issue and TVUS to assess anatomy. On bedside US she likely has several large fibroids, await formal US. In meantime will stop micronor and start provera for ongoing bleeding.

## 2023-09-26 NOTE — Addendum Note (Signed)
Addended by: Marjo Bicker on: 09/26/2023 05:54 PM   Modules accepted: Orders

## 2023-09-26 NOTE — Progress Notes (Signed)
GYNECOLOGY OFFICE VISIT NOTE  History:   [name] is a 48 y.o. [id number] here today for abnormal menses.  Per review of chart has had DUB since at least 06/2023 with heavy bleeding and clots Also has hx of iron deficiency anemia, thought to be secondary to heavy menses that she has been seeing heme for and receiving IV iron  Today reports has always had a very heavy period but has been terrible for about a year Recent period lasted for a month Has clots, very heavy flow, lots of discomfort Does not smoke No family hx of cancer of any kind Has a hx of two prior cesareans No other surgeries Mother has fibroids, she is unsure if she has any Patient's last menstrual period was 09/08/2023. No sexual activity for last two months Currently taking micronor   Health Maintenance Due  Topic Date Due   Pneumococcal Vaccine 27-21 Years old (1 of 2 - PCV) Never done   FOOT EXAM  Never done   OPHTHALMOLOGY EXAM  03/05/2023   INFLUENZA VACCINE  04/06/2023   HEMOGLOBIN A1C  05/01/2023   COVID-19 Vaccine (4 - 2024-25 season) 05/07/2023   Diabetic kidney evaluation - Urine ACR  05/12/2023    Past Medical History:  Diagnosis Date   Beta thalassemia trait    Essential hypertension, benign 06/12/2012   [facility] abstract note also started on two  new meds for this see med list.   No pertinent past medical history     Past Surgical History:  Procedure Laterality Date   CESAREAN SECTION     CESAREAN SECTION  04/17/2012   Procedure: CESAREAN SECTION;  Surgeon: Brock Bad, MD;  Location: [facility];  Service: Gynecology;  Laterality: N/A;    The following portions of the patient's history were reviewed and updated as appropriate: allergies, current medications, past family history, past medical history, past social history, past surgical history and problem list.   Health Maintenance:   Last pap: Lab Results  Component Value Date   DIAGPAP  01/05/2022    -  Negative for intraepithelial lesion or malignancy (NILM)   HPVHIGH Negative 01/05/2022    Last mammogram:  03/16/2022 - BIRADS 1   Review of Systems:  Pertinent items noted in HPI and remainder of comprehensive ROS otherwise negative.  Physical Exam:  BP (!) 145/87   Pulse 72   LMP 09/08/2023   Breastfeeding No  CONSTITUTIONAL: Well-developed, well-nourished female in no acute distress.  HEENT:  Normocephalic, atraumatic. External right and left ear normal. No scleral icterus.  NECK: Normal range of motion, supple, no masses noted on observation SKIN: No rash noted. Not diaphoretic. No erythema. No pallor. MUSCULOSKELETAL: Normal range of motion. No edema noted. NEUROLOGIC: Alert and oriented to person, place, and time. Normal muscle tone coordination.  PSYCHIATRIC: Normal mood and affect. Normal behavior. Normal judgment and thought content. RESPIRATORY: Effort normal, no problems with respiration noted ABDOMEN: No masses noted. No other overt distention noted.   PELVIC: Normal appearing external genitalia; normal appearing vaginal mucosa and cervix.  No abnormal discharge noted.  Labs and Imaging No results found for this or any previous visit (from the past week). No results found.    Assessment and Plan:   Problem List Items Addressed This Visit       Genitourinary   DUB (dysfunctional uterine bleeding) - Primary   Discussed workup to include EMB which was perofrmed today without issue and TVUS to assess anatomy.  On bedside US she likely has several large fibroids, await formal US. In meantime will stop micronor and start provera for ongoing bleeding.       Relevant Medications   medroxyPROGESTERone (PROVERA) 10 MG tablet   Other Relevant Orders   US PELVIC COMPLETE WITH TRANSVAGINAL   Other Visit Diagnoses       Encounter for screening mammogram for malignant neoplasm of breast       Relevant Orders   MM 3D DIAGNOSTIC MAMMOGRAM BILATERAL BREAST       Routine  preventative health maintenance measures emphasized. Please refer to After Visit Summary for other counseling recommendations.   Return pending results.    Total face-to-face time with patient: 20 minutes.  Over 50% of encounter was spent on counseling and coordination of care.   Venora Maples, MD/MPH Attending Family Medicine Physician, [facility] for [facility], [facility]

## 2023-09-28 ENCOUNTER — Encounter: Payer: Self-pay | Admitting: Family Medicine

## 2023-09-28 LAB — SURGICAL PATHOLOGY

## 2023-09-28 NOTE — Progress Notes (Signed)
Insufficient pathology, will offer to repeat in a month or two in the office once provera has slowed down AUB vs hysteroscopy/D&C in the OR

## 2023-09-29 ENCOUNTER — Encounter: Payer: Self-pay | Admitting: Medical Oncology

## 2023-10-02 ENCOUNTER — Telehealth: Payer: Self-pay | Admitting: Hematology & Oncology

## 2023-10-02 NOTE — Telephone Encounter (Signed)
Called to schedule IV Iron.

## 2023-10-04 ENCOUNTER — Encounter: Payer: Self-pay | Admitting: Family Medicine

## 2023-10-04 DIAGNOSIS — Z111 Encounter for screening for respiratory tuberculosis: Secondary | ICD-10-CM

## 2023-10-05 ENCOUNTER — Other Ambulatory Visit (INDEPENDENT_AMBULATORY_CARE_PROVIDER_SITE_OTHER): Payer: Medicaid Other

## 2023-10-05 DIAGNOSIS — Z111 Encounter for screening for respiratory tuberculosis: Secondary | ICD-10-CM

## 2023-10-09 ENCOUNTER — Encounter: Payer: Self-pay | Admitting: Family Medicine

## 2023-10-09 ENCOUNTER — Inpatient Hospital Stay: Payer: Medicaid Other | Attending: Hematology & Oncology

## 2023-10-09 VITALS — BP 136/74 | HR 67 | Temp 98.2°F | Resp 18

## 2023-10-09 DIAGNOSIS — R5383 Other fatigue: Secondary | ICD-10-CM | POA: Diagnosis not present

## 2023-10-09 DIAGNOSIS — D500 Iron deficiency anemia secondary to blood loss (chronic): Secondary | ICD-10-CM | POA: Diagnosis not present

## 2023-10-09 DIAGNOSIS — D563 Thalassemia minor: Secondary | ICD-10-CM | POA: Diagnosis not present

## 2023-10-09 DIAGNOSIS — N920 Excessive and frequent menstruation with regular cycle: Secondary | ICD-10-CM | POA: Diagnosis not present

## 2023-10-09 LAB — QUANTIFERON-TB GOLD PLUS
Mitogen-NIL: 7.78 [IU]/mL
NIL: 0.04 [IU]/mL
QuantiFERON-TB Gold Plus: NEGATIVE
TB1-NIL: 0.01 [IU]/mL
TB2-NIL: 0.03 [IU]/mL

## 2023-10-09 MED ORDER — SODIUM CHLORIDE 0.9 % IV SOLN: Freq: Once | INTRAVENOUS | Status: AC

## 2023-10-09 MED ORDER — SODIUM CHLORIDE 0.9 % IV SOLN
300.0000 mg | Freq: Once | INTRAVENOUS | Status: AC
  Administered 2023-10-09: 300 mg via INTRAVENOUS
  Filled 2023-10-09: qty 300

## 2023-10-09 NOTE — Patient Instructions (Signed)

## 2023-10-12 ENCOUNTER — Encounter: Payer: Self-pay | Admitting: Family

## 2023-10-13 ENCOUNTER — Ambulatory Visit (HOSPITAL_COMMUNITY)
Admission: RE | Admit: 2023-10-13 | Discharge: 2023-10-13 | Disposition: A | Discharge status: Home / Self Care | Payer: Medicaid Other | Source: Ambulatory Visit | Attending: Family Medicine | Admitting: Family Medicine

## 2023-10-13 DIAGNOSIS — N938 Other specified abnormal uterine and vaginal bleeding: Secondary | ICD-10-CM | POA: Diagnosis not present

## 2023-10-13 DIAGNOSIS — N939 Abnormal uterine and vaginal bleeding, unspecified: Secondary | ICD-10-CM | POA: Diagnosis not present

## 2023-10-13 DIAGNOSIS — N83292 Other ovarian cyst, left side: Secondary | ICD-10-CM | POA: Diagnosis not present

## 2023-10-13 DIAGNOSIS — D259 Leiomyoma of uterus, unspecified: Secondary | ICD-10-CM | POA: Diagnosis not present

## 2023-10-13 DIAGNOSIS — N854 Malposition of uterus: Secondary | ICD-10-CM | POA: Diagnosis not present

## 2023-10-16 ENCOUNTER — Encounter: Payer: Self-pay | Admitting: Family Medicine

## 2023-10-17 ENCOUNTER — Ambulatory Visit: Payer: Medicaid Other

## 2023-10-25 DIAGNOSIS — Z0120 Encounter for dental examination and cleaning without abnormal findings: Secondary | ICD-10-CM | POA: Diagnosis not present

## 2023-10-27 ENCOUNTER — Ambulatory Visit: Payer: Medicaid Other

## 2023-11-14 ENCOUNTER — Inpatient Hospital Stay (HOSPITAL_BASED_OUTPATIENT_CLINIC_OR_DEPARTMENT_OTHER): Payer: Medicaid Other | Admitting: Medical Oncology

## 2023-11-14 ENCOUNTER — Inpatient Hospital Stay: Payer: Medicaid Other | Attending: Hematology & Oncology

## 2023-11-14 ENCOUNTER — Encounter: Payer: Self-pay | Admitting: Medical Oncology

## 2023-11-14 VITALS — BP 129/82 | HR 73 | Temp 98.5°F | Resp 18 | Ht 65.0 in | Wt 231.1 lb

## 2023-11-14 DIAGNOSIS — N920 Excessive and frequent menstruation with regular cycle: Secondary | ICD-10-CM | POA: Diagnosis not present

## 2023-11-14 DIAGNOSIS — D259 Leiomyoma of uterus, unspecified: Secondary | ICD-10-CM | POA: Diagnosis not present

## 2023-11-14 DIAGNOSIS — D563 Thalassemia minor: Secondary | ICD-10-CM

## 2023-11-14 DIAGNOSIS — D500 Iron deficiency anemia secondary to blood loss (chronic): Secondary | ICD-10-CM | POA: Insufficient documentation

## 2023-11-14 DIAGNOSIS — R5383 Other fatigue: Secondary | ICD-10-CM | POA: Diagnosis not present

## 2023-11-14 LAB — CBC
HCT: 32.6 % — ABNORMAL LOW (ref 36.0–46.0)
Hemoglobin: 10.2 g/dL — ABNORMAL LOW (ref 12.0–15.0)
MCH: 21.8 pg — ABNORMAL LOW (ref 26.0–34.0)
MCHC: 31.3 g/dL (ref 30.0–36.0)
MCV: 69.7 fL — ABNORMAL LOW (ref 80.0–100.0)
Platelets: 219 10*3/uL (ref 150–400)
RBC: 4.68 MIL/uL (ref 3.87–5.11)
RDW: 17.5 % — ABNORMAL HIGH (ref 11.5–15.5)
WBC: 5.3 10*3/uL (ref 4.0–10.5)
nRBC: 0 % (ref 0.0–0.2)

## 2023-11-14 LAB — IRON AND IRON BINDING CAPACITY (CC-WL,HP ONLY)
Iron: 44 ug/dL (ref 28–170)
Saturation Ratios: 13 % (ref 10.4–31.8)
TIBC: 339 ug/dL (ref 250–450)
UIBC: 295 ug/dL (ref 148–442)

## 2023-11-14 LAB — FERRITIN: Ferritin: 41 ng/mL (ref 11–307)

## 2023-11-14 NOTE — Progress Notes (Signed)
 Hematology and Oncology Follow Up Visit  [name] [id number] Dec 16, 1975 48 y.o. 11/14/2023   Principle Diagnosis:  Iron deficiency anemia - heavy menses  Beta Thalassemia trait   Current Therapy:        IV iron as indicated- Venofer- last dose 10/09/2023 Folic acid 1 mg PO daily    Interim History:  [name] is here today for follow-up.   She reports that she was taken off of OCP and put on Provera to help with her heavy and long menstrual cycles.  She has been seen by GYN. She has had uterine ultrasound which showed fibroids. They are planning on completing a uterine biopsy as well.  Fatigue is still present, palpitations have resolved, SOB is still here when she exercises occasional dizziness.  She is taking her folic acid.  No other blood loss noted.  No bruising or petechiae.  No fever, chills, n/v, cough, rash, chest pain,  abdominal pain or changes in bowel or bladder habits.  No falls or syncope reported.  Appetite and hydration are good.   ECOG Performance Status: 0 - Asymptomatic Wt Readings from Last 3 Encounters:  11/14/23 231 lb 1.9 oz (104.8 kg)  09/12/23 230 lb 6.4 oz (104.5 kg)  07/10/23 226 lb 1.3 oz (102.5 kg)    Medications:  Allergies as of 11/14/2023       Reactions   Beet [beta Vulgaris] Hives, Itching, Rash   Fluid filled blisters   Amoxicillin Rash        Medication List        Accurate as of November 14, 2023  9:28 AM. If you have any questions, ask your nurse or doctor.          amLODipine 5 MG tablet Commonly known as: NORVASC TAKE 1 TABLET BY MOUTH IN THE MORNING AND AT BEDTIME   carvedilol 12.5 MG tablet Commonly known as: COREG Take 1 tablet (12.5 mg total) by mouth 2 (two) times daily with a meal.   Cholecalciferol 100 MCG (4000 UT) Tabs Take 4,000 Units by mouth daily.   famotidine 40 MG tablet Commonly known as: Pepcid Take 1 tablet (40 mg total) by mouth daily.   fluticasone 50 MCG/ACT nasal  spray Commonly known as: FLONASE SHAKE LIQUID AND USE 2 SPRAYS IN EACH NOSTRIL DAILY   folic acid 1 MG tablet Commonly known as: FOLVITE Take 1 tablet (1 mg total) by mouth daily.   hydrOXYzine 25 MG capsule Commonly known as: VISTARIL Take 1 capsule (25 mg total) by mouth every 8 (eight) hours as needed for itching.   medroxyPROGESTERone 10 MG tablet Commonly known as: Provera Take 1 tablet (10 mg total) by mouth daily.   meloxicam 15 MG tablet Commonly known as: MOBIC TAKE 1 TABLET BY MOUTH DAILY AS NEEDED FOR KNEE PAIN   multivitamin tablet Take 1 tablet by mouth daily.   zinc gluconate 50 MG tablet Take 50 mg by mouth daily.        Allergies:  Allergies  Allergen Reactions   Beet [Beta Vulgaris] Hives, Itching and Rash    Fluid filled blisters   Amoxicillin Rash    Past Medical History, Surgical history, Social history, and Family History were reviewed and updated.  Review of Systems: All other 10 point review of systems is negative.   Physical Exam:  height is 5\' 5"  (1.651 m) and weight is 231 lb 1.9 oz (104.8 kg). Her oral temperature is 98.5 F (36.9 C). Her blood pressure is  129/82 and her pulse is 73. Her respiration is 18 and oxygen saturation is 100%.   Wt Readings from Last 3 Encounters:  11/14/23 231 lb 1.9 oz (104.8 kg)  09/12/23 230 lb 6.4 oz (104.5 kg)  07/10/23 226 lb 1.3 oz (102.5 kg)    Ocular: Sclerae unicteric, pupils equal, round and reactive to light Ear-nose-throat: Oropharynx clear, dentition fair Lymphatic: No cervical or supraclavicular adenopathy Lungs no rales or rhonchi, good excursion bilaterally Heart regular rate and rhythm, no murmur appreciated Abd soft, nontender, positive bowel sounds MSK no focal spinal tenderness, no joint edema Neuro: non-focal, well-oriented, appropriate affect   Lab Results  Component Value Date   WBC 5.3 11/14/2023   HGB 10.2 (L) 11/14/2023   HCT 32.6 (L) 11/14/2023   MCV 69.7 (L)  11/14/2023   PLT 219 11/14/2023   Lab Results  Component Value Date   FERRITIN 47 09/12/2023   IRON 53 09/12/2023   TIBC 374 09/12/2023   UIBC 321 09/12/2023   IRONPCTSAT 14 09/12/2023   Lab Results  Component Value Date   RETICCTPCT 1.4 09/12/2023   RBC 4.68 11/14/2023   RETICCTABS 46.10 11/29/2010   No results found for: "KPAFRELGTCHN", "LAMBDASER", "KAPLAMBRATIO" No results found for: "IGGSERUM", "IGA", "IGMSERUM" No results found for: "TOTALPROTELP", "ALBUMINELP", "A1GS", "A2GS", "BETS", "BETA2SER", "GAMS", "MSPIKE", "SPEI"   Chemistry      Component Value Date/Time   NA 138 02/02/2023 1014   NA 136 07/27/2022 1010   K 3.7 02/02/2023 1014   CL 103 02/02/2023 1014   CO2 27 02/02/2023 1014   BUN 12 02/02/2023 1014   BUN 10 07/27/2022 1010   CREATININE 0.78 02/02/2023 1014   CREATININE 0.81 06/24/2016 1015      Component Value Date/Time   CALCIUM 9.2 02/02/2023 1014   ALKPHOS 42 02/02/2023 1014   AST 11 02/02/2023 1014   ALT 10 02/02/2023 1014   BILITOT 0.5 02/02/2023 1014   BILITOT 0.4 07/27/2022 1010      Encounter Diagnoses  Name Primary?   Iron deficiency anemia due to chronic blood loss Yes   Beta thalassemia trait    Impression and Plan: [name] is a very pleasant 48 yo [place] American female with history of both iron deficiency anemia secondary to heavy menses as well as the beta thalassemia trait.   Hgb is 10.2 which is stable. Down slightly- likely due to heavy menstrual bleeding Iron studies pending but based on CBC appears to be low. We will get her scheduled for IV iron.  Continue GYN follow up and work up  Continue Folic acid 1 mg PO daily.  RTC 2 months APP, labs (CBC, iron, ferritin)  Rushie Chestnut, PA-C 3/11/20259:28 AM

## 2023-11-17 ENCOUNTER — Encounter: Payer: Self-pay | Admitting: Family Medicine

## 2023-11-17 ENCOUNTER — Other Ambulatory Visit: Payer: Self-pay

## 2023-11-17 ENCOUNTER — Ambulatory Visit: Payer: Medicaid Other | Admitting: Family Medicine

## 2023-11-17 ENCOUNTER — Other Ambulatory Visit (HOSPITAL_COMMUNITY)
Admission: RE | Admit: 2023-11-17 | Discharge: 2023-11-17 | Disposition: A | Discharge status: Home / Self Care | Source: Ambulatory Visit | Attending: Family Medicine | Admitting: Family Medicine

## 2023-11-17 VITALS — BP 135/85 | HR 69 | Wt 228.8 lb

## 2023-11-17 DIAGNOSIS — Z3202 Encounter for pregnancy test, result negative: Secondary | ICD-10-CM | POA: Diagnosis not present

## 2023-11-17 DIAGNOSIS — Z975 Presence of (intrauterine) contraceptive device: Secondary | ICD-10-CM | POA: Insufficient documentation

## 2023-11-17 DIAGNOSIS — N938 Other specified abnormal uterine and vaginal bleeding: Secondary | ICD-10-CM | POA: Diagnosis not present

## 2023-11-17 DIAGNOSIS — Z30430 Encounter for insertion of intrauterine contraceptive device: Secondary | ICD-10-CM

## 2023-11-17 LAB — POCT PREGNANCY, URINE: Preg Test, Ur: NEGATIVE

## 2023-11-17 MED ORDER — MEDROXYPROGESTERONE ACETATE 10 MG PO TABS: 15.0000 mg | ORAL_TABLET | Freq: Every day | ORAL | 2 refills | Status: DC

## 2023-11-17 MED ORDER — LEVONORGESTREL 20.1 MCG/DAY IU IUD
1.0000 | INTRAUTERINE_SYSTEM | Freq: Once | INTRAUTERINE | Status: AC
Start: 2023-11-17 — End: 2023-11-17
  Administered 2023-11-17: 1 via INTRAUTERINE

## 2023-11-17 NOTE — Patient Instructions (Signed)
 Take 15 mg of Provera pills once a day for the next month, then stop. Come back to see me in 2 months.

## 2023-11-17 NOTE — Assessment & Plan Note (Addendum)
 Ongoing bleeding but massively improved since we last saw each other. Recommended repeating EMB today and patient was amenable, see procedure note. Also reviewed options at patient's request for controlling bleeding. We reviewed full gamut of options. First asked about surgery and I told her given HTN and DM2 diagnoses this would not be a first choice and is also very morbid. Then discussed hormonal IUD, endometrial ablation, sonata, myomectomy, and medications. After discussion she elected for placement of hormonal IUD immediately after endometrial biopsy, see procedure note. Given ongoing bleeding will increase dose of Provera to 15 mg daily for one month then stop. See back in 2 months to see how her bleeding is doing and a string check.

## 2023-11-17 NOTE — Progress Notes (Signed)
 GYNECOLOGY OFFICE VISIT NOTE  History:   [name] is a 48 y.o. [id number] here today for follow up of AUB.  09/26/2023 First visit, reported AUB at that time and EMB was performed. However insufficient cells to provide diagnosis and fairly heavy bleeding. Rx sent for Provera with plan to follow up in two months and repeat EMB. TVUS also performed and showed fibroid burden.  Today reports bleeding is better than previously Still having daily spotting but every once in a while is getting a batch of clots Very tired and would like bleeding to stop completely She is interested in discussing options   Health Maintenance Due  Topic Date Due   Pneumococcal Vaccine 23-68 Years old (1 of 2 - PCV) Never done   FOOT EXAM  Never done   OPHTHALMOLOGY EXAM  03/05/2023   INFLUENZA VACCINE  04/06/2023   HEMOGLOBIN A1C  05/01/2023   COVID-19 Vaccine (4 - 2024-25 season) 05/07/2023   Diabetic kidney evaluation - Urine ACR  05/12/2023    Past Medical History:  Diagnosis Date   Beta thalassemia trait    Essential hypertension, benign 06/12/2012   [facility] abstract note also started on two  new meds for this see med list.   No pertinent past medical history     Past Surgical History:  Procedure Laterality Date   CESAREAN SECTION     CESAREAN SECTION  04/17/2012   Procedure: CESAREAN SECTION;  Surgeon: Brock Bad, MD;  Location: [facility];  Service: Gynecology;  Laterality: N/A;    The following portions of the patient's history were reviewed and updated as appropriate: allergies, current medications, past family history, past medical history, past social history, past surgical history and problem list.   Health Maintenance:   Last pap: Result Date Procedure Results Follow-ups  01/05/2022 Cytology - PAP High risk HPV: Negative Adequacy: Satisfactory for evaluation; transformation zone component ABSENT. Diagnosis: - Negative for intraepithelial lesion or malignancy  (NILM) Comment: Normal Reference Range HPV - Negative     Last mammogram:  03/16/2022 - BIRADS 1  Repeat scheduled for later this month   Review of Systems:  Pertinent items noted in HPI and remainder of comprehensive ROS otherwise negative.  Physical Exam:  BP 135/85   Pulse 69   Wt 228 lb 12.8 oz (103.8 kg)   LMP 11/06/2023 (Exact Date)   BMI 38.07 kg/m  CONSTITUTIONAL: Well-developed, well-nourished female in no acute distress.  HEENT:  Normocephalic, atraumatic. External right and left ear normal. No scleral icterus.  NECK: Normal range of motion, supple, no masses noted on observation SKIN: No rash noted. Not diaphoretic. No erythema. No pallor. MUSCULOSKELETAL: Normal range of motion. No edema noted. NEUROLOGIC: Alert and oriented to person, place, and time. Normal muscle tone coordination.  PSYCHIATRIC: Normal mood and affect. Normal behavior. Normal judgment and thought content. RESPIRATORY: Effort normal, no problems with respiration noted ABDOMEN: No masses noted. No other overt distention noted.   PELVIC: Normal appearing external genitalia; normal appearing vaginal mucosa and cervix.  No abnormal discharge noted.   Labs and Imaging Results for orders placed or performed in visit on 11/17/23 (from the past week)  Pregnancy, urine POC   Collection Time: 11/17/23 11:45 AM  Result Value Ref Range   Preg Test, Ur NEGATIVE NEGATIVE  Results for orders placed or performed in visit on 11/14/23 (from the past week)  Ferritin   Collection Time: 11/14/23  9:09 AM  Result Value Ref Range  Ferritin 41 11 - 307 ng/mL  Iron and Iron Binding Capacity ([facility] only)   Collection Time: 11/14/23  9:09 AM  Result Value Ref Range   Iron 44 28 - 170 ug/dL   TIBC 161 096 - 045 ug/dL   Saturation Ratios 13 10.4 - 31.8 %   UIBC 295 148 - 442 ug/dL  CBC   Collection Time: 11/14/23  9:09 AM  Result Value Ref Range   WBC 5.3 4.0 - 10.5 K/uL   RBC 4.68 3.87 - 5.11 MIL/uL    Hemoglobin 10.2 (L) 12.0 - 15.0 g/dL   HCT 40.9 (L) 81.1 - 91.4 %   MCV 69.7 (L) 80.0 - 100.0 fL   MCH 21.8 (L) 26.0 - 34.0 pg   MCHC 31.3 30.0 - 36.0 g/dL   RDW 78.2 (H) 95.6 - 21.3 %   Platelets 219 150 - 400 K/uL   nRBC 0.0 0.0 - 0.2 %   No results found.    Assessment and Plan:   Problem List Items Addressed This Visit       Genitourinary   DUB (dysfunctional uterine bleeding) - Primary   Ongoing bleeding but massively improved since we last saw each other. Recommended repeating EMB today and patient was amenable, see procedure note. Also reviewed options at patient's request for controlling bleeding. We reviewed full gamut of options. First asked about surgery and I told her given HTN and DM2 diagnoses this would not be a first choice and is also very morbid. Then discussed hormonal IUD, endometrial ablation, sonata, myomectomy, and medications. After discussion she elected for placement of hormonal IUD immediately after endometrial biopsy, see procedure note. Given ongoing bleeding will increase dose of Provera to 15 mg daily for one month then stop. See back in 2 months to see how her bleeding is doing and a string check.       Relevant Medications   medroxyPROGESTERone (PROVERA) 10 MG tablet   Other Relevant Orders   Surgical pathology( [place]/ [facility])     Other   IUD (intrauterine device) in place   Other Visit Diagnoses       Encounter for IUD insertion       Relevant Medications   levonorgestrel (LILETTA) 20.1 MCG/DAY IUD 1 each (Completed)   Other Relevant Orders   Pregnancy, urine POC (Completed)       Routine preventative health maintenance measures emphasized. Please refer to After Visit Summary for other counseling recommendations.   Return in about 2 months (around 01/17/2024) for IUD string check, DUB check.    Total face-to-face time with patient: 25 minutes.  Over 50% of encounter was spent on counseling and coordination of care.   Venora Maples, MD/MPH Attending Family Medicine Physician, [facility] for [facility], [facility]

## 2023-11-17 NOTE — Progress Notes (Signed)
    GYNECOLOGY CLINIC ENDOMETRIAL BIOPSY PROCEDURE NOTE  Kianni A Hersman is a y 48 y.o. [phone] here for endometrial biopsy for abnormal uterine bleeding.  Discussed nature of the procedure and risks and benefits.  Pregnancy test:  Lab Results  Component Value Date   PREGTESTUR NEGATIVE 11/17/2023    Allergies  Allergen Reactions   Beet [Beta Vulgaris] Hives, Itching and Rash    Fluid filled blisters   Amoxicillin Rash    Patient given informed consent, signed copy in the chart, time out was performed.    The patient was placed in the lithotomy position and the cervix brought into view with sterile speculum.  Cervix cleansed x 2 with betadine swabs. A tenaculum was placed in the anterior lip of the cervix. The uterus was sounded for depth of 10 cm. A pipelle was introduced to into the uterus, suction created,  and an endometrial sample was obtained. A second pass was performed to ensure adequate sampling. The patient tolerated the procedure well. We then proceeded immediately with IUD insertion, see below.  Per patient preference she will be notified of results by telephone.  Venora Maples, MD/MPH Attending Family Medicine Physician, [facility] for [facility], [facility]       GYNECOLOGY OFFICE PROCEDURE NOTE  Jatara A Orchard is a 48 y.o. [phone] here for Liletta IUD insertion. Last pap smear:     Component Value Date/Time   DIAGPAP  01/05/2022 0912    - Negative for intraepithelial lesion or malignancy (NILM)   HPVHIGH Negative 01/05/2022 0912   ADEQPAP  01/05/2022 0912    Satisfactory for evaluation; transformation zone component ABSENT.    Urine pregnancy test: negative  IUD Insertion Procedure Note Patient identified, informed consent performed, consent signed.   Discussed risks of irregular bleeding, increased cramping, infection, malpositioning or misplacement of the IUD outside the uterus which may require  further procedure such as laparoscopy. Also discussed >99% contraception efficacy, increased risk of ectopic pregnancy with failure of method.  Time out was performed.  Speculum placed in the vagina.  Cervix visualized.  Cleaned with Betadine x 2.  Grasped anteriorly with a single tooth tenaculum.  Uterus sounded to 10 cm. IUD placed per manufacturer's recommendations.  Strings trimmed to 3 cm. Tenaculum was removed, good hemostasis noted.  Patient tolerated procedure well.   Patient was given post-procedure instructions.  She was advised to have backup contraception for one week.  Patient was also asked to check IUD strings periodically and follow up in 8 weeks for IUD check.  Venora Maples, MD/MPH Attending Family Medicine Physician, [facility] for [facility], [facility]

## 2023-11-21 ENCOUNTER — Ambulatory Visit
Admission: RE | Admit: 2023-11-21 | Discharge: 2023-11-21 | Disposition: A | Discharge status: Home / Self Care | Payer: Medicaid Other | Source: Ambulatory Visit | Attending: Family Medicine | Admitting: Family Medicine

## 2023-11-21 ENCOUNTER — Encounter: Payer: Self-pay | Admitting: Family

## 2023-11-21 DIAGNOSIS — Z1231 Encounter for screening mammogram for malignant neoplasm of breast: Secondary | ICD-10-CM | POA: Diagnosis not present

## 2023-11-21 LAB — SURGICAL PATHOLOGY

## 2023-11-22 ENCOUNTER — Encounter: Payer: Self-pay | Admitting: Family Medicine

## 2023-11-23 ENCOUNTER — Encounter: Payer: Self-pay | Admitting: Family Medicine

## 2023-11-24 ENCOUNTER — Inpatient Hospital Stay

## 2023-11-24 VITALS — BP 130/80 | HR 63 | Temp 98.2°F | Resp 16

## 2023-11-24 DIAGNOSIS — D500 Iron deficiency anemia secondary to blood loss (chronic): Secondary | ICD-10-CM

## 2023-11-24 DIAGNOSIS — R5383 Other fatigue: Secondary | ICD-10-CM | POA: Diagnosis not present

## 2023-11-24 DIAGNOSIS — D563 Thalassemia minor: Secondary | ICD-10-CM | POA: Diagnosis not present

## 2023-11-24 DIAGNOSIS — D259 Leiomyoma of uterus, unspecified: Secondary | ICD-10-CM | POA: Diagnosis not present

## 2023-11-24 DIAGNOSIS — N920 Excessive and frequent menstruation with regular cycle: Secondary | ICD-10-CM | POA: Diagnosis not present

## 2023-11-24 MED ORDER — SODIUM CHLORIDE 0.9 % IV SOLN
300.0000 mg | Freq: Once | INTRAVENOUS | Status: AC
  Administered 2023-11-24: 300 mg via INTRAVENOUS
  Filled 2023-11-24: qty 300

## 2023-11-24 MED ORDER — SODIUM CHLORIDE 0.9 % IV SOLN: Freq: Once | INTRAVENOUS | Status: AC

## 2023-11-24 NOTE — Patient Instructions (Signed)

## 2023-12-01 ENCOUNTER — Inpatient Hospital Stay

## 2023-12-01 VITALS — BP 135/84 | HR 63 | Temp 98.1°F | Resp 17

## 2023-12-01 DIAGNOSIS — R5383 Other fatigue: Secondary | ICD-10-CM | POA: Diagnosis not present

## 2023-12-01 DIAGNOSIS — D500 Iron deficiency anemia secondary to blood loss (chronic): Secondary | ICD-10-CM | POA: Diagnosis not present

## 2023-12-01 DIAGNOSIS — D259 Leiomyoma of uterus, unspecified: Secondary | ICD-10-CM | POA: Diagnosis not present

## 2023-12-01 DIAGNOSIS — N920 Excessive and frequent menstruation with regular cycle: Secondary | ICD-10-CM | POA: Diagnosis not present

## 2023-12-01 DIAGNOSIS — D563 Thalassemia minor: Secondary | ICD-10-CM | POA: Diagnosis not present

## 2023-12-01 MED ORDER — SODIUM CHLORIDE 0.9 % IV SOLN: Freq: Once | INTRAVENOUS | Status: AC

## 2023-12-01 MED ORDER — SODIUM CHLORIDE 0.9 % IV SOLN
300.0000 mg | Freq: Once | INTRAVENOUS | Status: AC
  Administered 2023-12-01: 300 mg via INTRAVENOUS
  Filled 2023-12-01: qty 300

## 2023-12-08 ENCOUNTER — Inpatient Hospital Stay: Attending: Medical Oncology

## 2023-12-08 VITALS — BP 136/79 | HR 67 | Temp 98.6°F | Resp 18

## 2023-12-08 DIAGNOSIS — D500 Iron deficiency anemia secondary to blood loss (chronic): Secondary | ICD-10-CM | POA: Insufficient documentation

## 2023-12-08 DIAGNOSIS — D563 Thalassemia minor: Secondary | ICD-10-CM | POA: Insufficient documentation

## 2023-12-08 DIAGNOSIS — N920 Excessive and frequent menstruation with regular cycle: Secondary | ICD-10-CM | POA: Diagnosis not present

## 2023-12-08 DIAGNOSIS — R5383 Other fatigue: Secondary | ICD-10-CM | POA: Diagnosis not present

## 2023-12-08 MED ORDER — SODIUM CHLORIDE 0.9 % IV SOLN
300.0000 mg | Freq: Once | INTRAVENOUS | Status: AC
  Administered 2023-12-08: 300 mg via INTRAVENOUS
  Filled 2023-12-08: qty 300

## 2023-12-08 MED ORDER — SODIUM CHLORIDE 0.9 % IV SOLN: Freq: Once | INTRAVENOUS | Status: AC

## 2023-12-08 NOTE — Patient Instructions (Signed)

## 2024-01-23 ENCOUNTER — Ambulatory Visit: Payer: Self-pay | Admitting: Medical Oncology

## 2024-01-23 ENCOUNTER — Encounter: Payer: Self-pay | Admitting: Medical Oncology

## 2024-01-23 ENCOUNTER — Inpatient Hospital Stay: Attending: Medical Oncology

## 2024-01-23 ENCOUNTER — Inpatient Hospital Stay (HOSPITAL_BASED_OUTPATIENT_CLINIC_OR_DEPARTMENT_OTHER): Admitting: Medical Oncology

## 2024-01-23 VITALS — BP 135/84 | HR 62 | Temp 98.2°F | Resp 18 | Ht 65.0 in | Wt 229.0 lb

## 2024-01-23 DIAGNOSIS — D649 Anemia, unspecified: Secondary | ICD-10-CM | POA: Diagnosis not present

## 2024-01-23 DIAGNOSIS — N920 Excessive and frequent menstruation with regular cycle: Secondary | ICD-10-CM | POA: Insufficient documentation

## 2024-01-23 DIAGNOSIS — D500 Iron deficiency anemia secondary to blood loss (chronic): Secondary | ICD-10-CM | POA: Insufficient documentation

## 2024-01-23 DIAGNOSIS — D563 Thalassemia minor: Secondary | ICD-10-CM | POA: Diagnosis not present

## 2024-01-23 LAB — IRON AND IRON BINDING CAPACITY (CC-WL,HP ONLY)
Iron: 97 ug/dL (ref 28–170)
Saturation Ratios: 30 % (ref 10.4–31.8)
TIBC: 323 ug/dL (ref 250–450)
UIBC: 226 ug/dL (ref 148–442)

## 2024-01-23 LAB — CBC
HCT: 38.1 % (ref 36.0–46.0)
Hemoglobin: 11.8 g/dL — ABNORMAL LOW (ref 12.0–15.0)
MCH: 21.4 pg — ABNORMAL LOW (ref 26.0–34.0)
MCHC: 31 g/dL (ref 30.0–36.0)
MCV: 69.1 fL — ABNORMAL LOW (ref 80.0–100.0)
Platelets: 217 10*3/uL (ref 150–400)
RBC: 5.51 MIL/uL — ABNORMAL HIGH (ref 3.87–5.11)
RDW: 17.2 % — ABNORMAL HIGH (ref 11.5–15.5)
WBC: 3.9 10*3/uL — ABNORMAL LOW (ref 4.0–10.5)
nRBC: 0 % (ref 0.0–0.2)

## 2024-01-23 LAB — FERRITIN: Ferritin: 141 ng/mL (ref 11–307)

## 2024-01-23 NOTE — Progress Notes (Signed)
 Hematology and Oncology Follow Up Visit  [name] [id number] 1975/12/14 48 y.o. 01/23/2024   Principle Diagnosis:  Iron  deficiency anemia - heavy menses  Beta Thalassemia trait   Current Therapy:        IV iron  as indicated- Venofer - last dose 10/09/2023 Folic acid  1 mg PO daily    Interim History:  [name] is here today for follow-up.   She is now off of her Provera  and had an IUD placed. She has been seen by GYN. She has had uterine ultrasound which showed fibroids. They are planning on completing a uterine biopsy as well.  Fatigue is still present but decreased. Palpitations have resolved, SOB has also resolved.  She is taking her folic acid .  No other blood loss noted.  No bruising or petechiae.  No fever, chills, n/v, cough, rash, chest pain,  abdominal pain or changes in bowel or bladder habits.  No falls or syncope reported.  Appetite and hydration are good.   ECOG Performance Status: 0 - Asymptomatic Wt Readings from Last 3 Encounters:  01/23/24 229 lb (103.9 kg)  11/17/23 228 lb 12.8 oz (103.8 kg)  11/14/23 231 lb 1.9 oz (104.8 kg)    Medications:  Allergies as of 01/23/2024       Reactions   Beet [beta Vulgaris] Hives, Itching, Rash   Fluid filled blisters   Amoxicillin Rash        Medication List        Accurate as of Jan 23, 2024 10:07 AM. If you have any questions, ask your nurse or doctor.          STOP taking these medications    medroxyPROGESTERone  10 MG tablet Commonly known as: Provera  Stopped by: Sharla Davis       TAKE these medications    amLODipine  5 MG tablet Commonly known as: NORVASC  TAKE 1 TABLET BY MOUTH IN THE MORNING AND AT BEDTIME   carvedilol  12.5 MG tablet Commonly known as: COREG  Take 1 tablet (12.5 mg total) by mouth 2 (two) times daily with a meal.   Cholecalciferol  100 MCG (4000 UT) Tabs Take 4,000 Units by mouth daily.   famotidine  40 MG tablet Commonly known as: Pepcid  Take 1  tablet (40 mg total) by mouth daily.   fluticasone  50 MCG/ACT nasal spray Commonly known as: FLONASE  SHAKE LIQUID AND USE 2 SPRAYS IN EACH NOSTRIL DAILY   folic acid  1 MG tablet Commonly known as: FOLVITE  Take 1 tablet (1 mg total) by mouth daily.   hydrOXYzine  25 MG capsule Commonly known as: VISTARIL  Take 1 capsule (25 mg total) by mouth every 8 (eight) hours as needed for itching.   meloxicam  15 MG tablet Commonly known as: MOBIC  TAKE 1 TABLET BY MOUTH DAILY AS NEEDED FOR KNEE PAIN   multivitamin tablet Take 1 tablet by mouth daily.   zinc gluconate 50 MG tablet Take 50 mg by mouth daily.        Allergies:  Allergies  Allergen Reactions   Beet [Beta Vulgaris] Hives, Itching and Rash    Fluid filled blisters   Amoxicillin Rash    Past Medical History, Surgical history, Social history, and Family History were reviewed and updated.  Review of Systems: All other 10 point review of systems is negative.   Physical Exam:  height is 5\' 5"  (1.651 m) and weight is 229 lb (103.9 kg). Her oral temperature is 98.2 F (36.8 C). Her blood pressure is 135/84 and her pulse is 62.  Her respiration is 18 and oxygen saturation is 99%.   Wt Readings from Last 3 Encounters:  01/23/24 229 lb (103.9 kg)  11/17/23 228 lb 12.8 oz (103.8 kg)  11/14/23 231 lb 1.9 oz (104.8 kg)    Ocular: Sclerae unicteric, pupils equal, round and reactive to light Ear-nose-throat: Oropharynx clear, dentition fair Lymphatic: No cervical or supraclavicular adenopathy Lungs no rales or rhonchi, good excursion bilaterally Heart regular rate and rhythm, no murmur appreciated Abd soft, nontender, positive bowel sounds MSK no focal spinal tenderness, no joint edema Neuro: non-focal, well-oriented, appropriate affect   Lab Results  Component Value Date   WBC 3.9 (L) 01/23/2024   HGB 11.8 (L) 01/23/2024   HCT 38.1 01/23/2024   MCV 69.1 (L) 01/23/2024   PLT 217 01/23/2024   Lab Results  Component  Value Date   FERRITIN 41 11/14/2023   IRON  44 11/14/2023   TIBC 339 11/14/2023   UIBC 295 11/14/2023   IRONPCTSAT 13 11/14/2023   Lab Results  Component Value Date   RETICCTPCT 1.4 09/12/2023   RBC 5.51 (H) 01/23/2024   RETICCTABS 46.10 11/29/2010   No results found for: "KPAFRELGTCHN", "LAMBDASER", "KAPLAMBRATIO" No results found for: "IGGSERUM", "IGA", "IGMSERUM" No results found for: "TOTALPROTELP", "ALBUMINELP", "A1GS", "A2GS", "BETS", "BETA2SER", "GAMS", "MSPIKE", "SPEI"   Chemistry      Component Value Date/Time   NA 138 02/02/2023 1014   NA 136 07/27/2022 1010   K 3.7 02/02/2023 1014   CL 103 02/02/2023 1014   CO2 27 02/02/2023 1014   BUN 12 02/02/2023 1014   BUN 10 07/27/2022 1010   CREATININE 0.78 02/02/2023 1014   CREATININE 0.81 06/24/2016 1015      Component Value Date/Time   CALCIUM 9.2 02/02/2023 1014   ALKPHOS 42 02/02/2023 1014   AST 11 02/02/2023 1014   ALT 10 02/02/2023 1014   BILITOT 0.5 02/02/2023 1014   BILITOT 0.4 07/27/2022 1010      Encounter Diagnoses  Name Primary?   Iron  deficiency anemia due to chronic blood loss Yes   Beta thalassemia trait    Anemia, unspecified type     Impression and Plan: [name] is a very pleasant 48 yo African American female with history of both iron  deficiency anemia secondary to heavy menses as well as the beta thalassemia trait.   Hgb is 11.8 which is up from previous.  Iron  studies pending - will replenish as needed Continue GYN follow up and work up Continue Folic acid  1 mg PO daily.    RTC 3 months APP, labs (CBC, iron , ferritin)  Sharla Davis, PA-C 5/20/202510:07 AM

## 2024-04-05 ENCOUNTER — Other Ambulatory Visit: Payer: Self-pay | Admitting: Internal Medicine

## 2024-04-05 DIAGNOSIS — I10 Essential (primary) hypertension: Secondary | ICD-10-CM

## 2024-04-24 ENCOUNTER — Ambulatory Visit: Payer: Self-pay | Admitting: Medical Oncology

## 2024-04-24 ENCOUNTER — Inpatient Hospital Stay: Attending: Medical Oncology

## 2024-04-24 ENCOUNTER — Encounter: Payer: Self-pay | Admitting: Medical Oncology

## 2024-04-24 ENCOUNTER — Inpatient Hospital Stay: Admitting: Medical Oncology

## 2024-04-24 VITALS — BP 153/87 | HR 65 | Temp 98.2°F | Resp 19 | Ht 65.0 in | Wt 232.0 lb

## 2024-04-24 DIAGNOSIS — R21 Rash and other nonspecific skin eruption: Secondary | ICD-10-CM

## 2024-04-24 DIAGNOSIS — D563 Thalassemia minor: Secondary | ICD-10-CM | POA: Diagnosis not present

## 2024-04-24 DIAGNOSIS — R002 Palpitations: Secondary | ICD-10-CM

## 2024-04-24 DIAGNOSIS — D649 Anemia, unspecified: Secondary | ICD-10-CM

## 2024-04-24 DIAGNOSIS — D500 Iron deficiency anemia secondary to blood loss (chronic): Secondary | ICD-10-CM | POA: Diagnosis not present

## 2024-04-24 DIAGNOSIS — N920 Excessive and frequent menstruation with regular cycle: Secondary | ICD-10-CM | POA: Insufficient documentation

## 2024-04-24 LAB — CBC WITH DIFFERENTIAL (CANCER CENTER ONLY)
Abs Immature Granulocytes: 0.01 K/uL (ref 0.00–0.07)
Basophils Absolute: 0 K/uL (ref 0.0–0.1)
Basophils Relative: 0 %
Eosinophils Absolute: 0.2 K/uL (ref 0.0–0.5)
Eosinophils Relative: 4 %
HCT: 37.2 % (ref 36.0–46.0)
Hemoglobin: 11.5 g/dL — ABNORMAL LOW (ref 12.0–15.0)
Immature Granulocytes: 0 %
Lymphocytes Relative: 44 %
Lymphs Abs: 2.1 K/uL (ref 0.7–4.0)
MCH: 20.9 pg — ABNORMAL LOW (ref 26.0–34.0)
MCHC: 30.9 g/dL (ref 30.0–36.0)
MCV: 67.5 fL — ABNORMAL LOW (ref 80.0–100.0)
Monocytes Absolute: 0.4 K/uL (ref 0.1–1.0)
Monocytes Relative: 8 %
Neutro Abs: 2 K/uL (ref 1.7–7.7)
Neutrophils Relative %: 44 %
Platelet Count: 230 K/uL (ref 150–400)
RBC: 5.51 MIL/uL — ABNORMAL HIGH (ref 3.87–5.11)
RDW: 18.2 % — ABNORMAL HIGH (ref 11.5–15.5)
WBC Count: 4.7 K/uL (ref 4.0–10.5)
nRBC: 0 % (ref 0.0–0.2)

## 2024-04-24 LAB — IRON AND IRON BINDING CAPACITY (CC-WL,HP ONLY)
Iron: 72 ug/dL (ref 28–170)
Saturation Ratios: 21 % (ref 10.4–31.8)
TIBC: 336 ug/dL (ref 250–450)
UIBC: 264 ug/dL

## 2024-04-24 LAB — FERRITIN: Ferritin: 154 ng/mL (ref 11–307)

## 2024-04-24 LAB — RETIC PANEL
Immature Retic Fract: 17.9 % — ABNORMAL HIGH (ref 2.3–15.9)
RBC.: 5.51 MIL/uL — ABNORMAL HIGH (ref 3.87–5.11)
Retic Count, Absolute: 59 K/uL (ref 19.0–186.0)
Retic Ct Pct: 1.1 % (ref 0.4–3.1)
Reticulocyte Hemoglobin: 25 pg — ABNORMAL LOW (ref >27.9)

## 2024-04-24 LAB — TSH: TSH: 0.455 u[IU]/mL (ref 0.350–4.500)

## 2024-04-24 MED ORDER — HYDROXYZINE PAMOATE 25 MG PO CAPS: 25.0000 mg | ORAL_CAPSULE | Freq: Three times a day (TID) | ORAL | 0 refills | Status: DC | PRN

## 2024-04-24 NOTE — Progress Notes (Signed)
 Hematology and Oncology Follow Up Visit  [name] [id number] 02/12/1976 48 y.o. 04/24/2024   Principle Diagnosis:  Iron  deficiency anemia - heavy menses  Beta Thalassemia trait   Current Therapy:        IV iron  as indicated- Venofer - last dose 10/09/2023 Folic acid  1 mg PO daily    Interim History:  [name] is here today for follow-up.   She now has an IUD. Menstrual cycles have greatly lightened.  She has been seen by GYN. She has had uterine ultrasound which showed fibroids. They are planning on completing a uterine biopsy as well.  Fatigue is still present but decreased. Palpitations have returned and are worse than normal. This has caused some anxiety. They occur mainly in the morning when she wakes up. No chest pains. She has noticed some night sweats. No unintentional weight loss. No new pains.  Colonoscopy- Cologuard- 2023 Negative She is taking her folic acid .  No other blood loss noted.  No bruising or petechiae.  No fever, chills, n/v, cough, rash, chest pain,  abdominal pain or changes in bowel or bladder habits.  No falls or syncope reported.  Appetite and hydration are good.   ECOG Performance Status: 0 - Asymptomatic Wt Readings from Last 3 Encounters:  04/24/24 232 lb (105.2 kg)  01/23/24 229 lb (103.9 kg)  11/17/23 228 lb 12.8 oz (103.8 kg)    Medications:  Allergies as of 04/24/2024       Reactions   Beet [beta Vulgaris] Hives, Itching, Rash   Fluid filled blisters   Amoxicillin Rash        Medication List        Accurate as of April 24, 2024 11:09 AM. If you have any questions, ask your nurse or doctor.          amLODipine  5 MG tablet Commonly known as: NORVASC  TAKE 1 TABLET BY MOUTH IN THE MORNING AND AT BEDTIME   carvedilol  12.5 MG tablet Commonly known as: COREG  Take 1 tablet (12.5 mg total) by mouth 2 (two) times daily with a meal.   Cholecalciferol  100 MCG (4000 UT) Tabs Take 4,000 Units by mouth daily.    famotidine  40 MG tablet Commonly known as: Pepcid  Take 1 tablet (40 mg total) by mouth daily.   fluticasone  50 MCG/ACT nasal spray Commonly known as: FLONASE  SHAKE LIQUID AND USE 2 SPRAYS IN EACH NOSTRIL DAILY   folic acid  1 MG tablet Commonly known as: FOLVITE  Take 1 tablet (1 mg total) by mouth daily.   hydrOXYzine  25 MG capsule Commonly known as: VISTARIL  Take 1 capsule (25 mg total) by mouth every 8 (eight) hours as needed for itching.   meloxicam  15 MG tablet Commonly known as: MOBIC  TAKE 1 TABLET BY MOUTH DAILY AS NEEDED FOR KNEE PAIN   multivitamin tablet Take 1 tablet by mouth daily.   zinc gluconate 50 MG tablet Take 50 mg by mouth daily.        Allergies:  Allergies  Allergen Reactions   Beet [Beta Vulgaris] Hives, Itching and Rash    Fluid filled blisters   Amoxicillin Rash    Past Medical History, Surgical history, Social history, and Family History were reviewed and updated.  Review of Systems: All other 10 point review of systems is negative.   Physical Exam:  height is 5' 5 (1.651 m) and weight is 232 lb (105.2 kg). Her oral temperature is 98.2 F (36.8 C). Her blood pressure is 150/82 (abnormal) and her pulse  is 65. Her respiration is 19 and oxygen saturation is 100%.   Wt Readings from Last 3 Encounters:  04/24/24 232 lb (105.2 kg)  01/23/24 229 lb (103.9 kg)  11/17/23 228 lb 12.8 oz (103.8 kg)    Ocular: Sclerae unicteric, pupils equal, round and reactive to light Ear-nose-throat: Oropharynx clear, dentition fair Lymphatic: No cervical or supraclavicular adenopathy Lungs no rales or rhonchi, good excursion bilaterally Heart regular rate and rhythm, no murmur appreciated Abd soft, nontender, positive bowel sounds MSK no focal spinal tenderness, no joint edema Neuro: non-focal, well-oriented, appropriate affect   Lab Results  Component Value Date   WBC 3.9 (L) 01/23/2024   HGB 11.8 (L) 01/23/2024   HCT 38.1 01/23/2024   MCV 69.1  (L) 01/23/2024   PLT 217 01/23/2024   Lab Results  Component Value Date   FERRITIN 141 01/23/2024   IRON  97 01/23/2024   TIBC 323 01/23/2024   UIBC 226 01/23/2024   IRONPCTSAT 30 01/23/2024   Lab Results  Component Value Date   RETICCTPCT 1.1 04/24/2024   RBC 5.51 (H) 04/24/2024   RETICCTABS 46.10 11/29/2010   No results found for: KPAFRELGTCHN, LAMBDASER, KAPLAMBRATIO No results found for: IGGSERUM, IGA, IGMSERUM No results found for: STEPHANY CARLOTA BENSON MARKEL EARLA JOANNIE DOC VICK, SPEI   Chemistry      Component Value Date/Time   NA 138 02/02/2023 1014   NA 136 07/27/2022 1010   K 3.7 02/02/2023 1014   CL 103 02/02/2023 1014   CO2 27 02/02/2023 1014   BUN 12 02/02/2023 1014   BUN 10 07/27/2022 1010   CREATININE 0.78 02/02/2023 1014   CREATININE 0.81 06/24/2016 1015      Component Value Date/Time   CALCIUM 9.2 02/02/2023 1014   ALKPHOS 42 02/02/2023 1014   AST 11 02/02/2023 1014   ALT 10 02/02/2023 1014   BILITOT 0.5 02/02/2023 1014   BILITOT 0.4 07/27/2022 1010      Encounter Diagnoses  Name Primary?   Iron  deficiency anemia due to chronic blood loss Yes   Beta thalassemia trait     Impression and Plan: [name] is a very pleasant 48 yo [place] American female with history of both iron  deficiency anemia secondary to heavy menses as well as the beta thalassemia trait.   Hgb is 11.5 which is stable.  Iron  studies pending - will replenish as needed Palpitations - TSH added to labs today- she will follow up with PCP and ask for a sleep study. [facility] rx refill for her hydroxyzine  given as requested by patient  Continue GYN follow up and work up Continue Folic acid  1 mg PO daily.    RTC 3 months APP, labs (CBC, iron , ferritin)  Lauraine CHRISTELLA Dais, PA-C 8/20/202511:09 AM

## 2024-04-29 ENCOUNTER — Ambulatory Visit: Payer: Self-pay | Admitting: Medical Oncology

## 2024-06-01 NOTE — Progress Notes (Unsigned)
 [facility] [address] [place], [place] [address] [phone]  Date:  06/05/2024   Name:  [name]   DOB:  02-01-1976   MRN:  [id number]  PCP:  Watt Harlene BROCKS, MD    Chief Complaint: No chief complaint on file.   History of Present Illness:  [name] is a 48 y.o. very pleasant female patient who presents with the following:  Patient seen today for physical exam.  I saw her most recently in February 2024; at that time she was an [employer] fund[employer] program History of HTN, diet-controlled DM, iron  deficiency anemia due to menorrhagia as well as beta Thal trait  Lab Results  Component Value Date   HGBA1C 6.3 10/31/2022   She is overdue for several health maintenance items - Eye exam - Labs, A1c, GFR, urine micro -recommend pneumonia vaccine - Recommend Shingrix - Recommend flu shot - Recommend COVID booster-recommend foot exam  She is also following up with hematology for iron  deficiency anemia, most recent visit was in August At that time they noted an IUD had greatly lightened her menstrual cycles and she was planning to do an endometrial biopsy Dr. Okey as her cardiologist  Discussed the use of AI scribe software for clinical note transcription with the patient, who gave verbal consent to proceed.  History of Present Illness    Patient Active Problem List   Diagnosis Date Noted   IUD (intrauterine device) in place 11/17/2023   DUB (dysfunctional uterine bleeding) 09/26/2023   Cellulitis of right hand 02/02/2023   IDA (iron  deficiency anemia) 11/02/2022   Controlled type 2 diabetes mellitus without complication, without long-term current use of insulin (HCC) 01/05/2022   Heart murmur 09/02/2015   Benign essential HTN 03/23/2015   Beta thalassemia trait 10/11/2011    Past Medical History:  Diagnosis Date   Beta thalassemia trait    Essential hypertension, benign 06/12/2012   [facility] abstract note also started on two  new meds for this see med list.   No pertinent past medical history     Past Surgical History:  Procedure Laterality Date   BREAST BIOPSY Left    CESAREAN SECTION     CESAREAN SECTION  04/17/2012   Procedure: CESAREAN SECTION;  Surgeon: Carlin DELENA Centers, MD;  Location: [facility];  Service: Gynecology;  Laterality: N/A;    Social History   Tobacco Use   Smoking status: Never   Smokeless tobacco: Never  Vaping Use   Vaping status: Never Used  Substance Use Topics   Alcohol use: No   Drug use: No    Family History  Problem Relation Age of Onset   Hypertension Mother    Sickle cell anemia Brother    BRCA 1/2 Neg Hx    Breast cancer Neg Hx     Allergies  Allergen Reactions   Beet [Beta Vulgaris] Hives, Itching and Rash    Fluid filled blisters   Amoxicillin Rash    Medication list has been reviewed and updated.  Current Outpatient Medications on File Prior to Visit  Medication Sig Dispense Refill   amLODipine  (NORVASC ) 5 MG tablet TAKE 1 TABLET BY MOUTH IN THE MORNING AND AT BEDTIME 15 tablet 0   carvedilol  (COREG ) 12.5 MG tablet Take 1 tablet (12.5 mg total) by mouth 2 (two) times daily with a meal. 60 tablet 0   Cholecalciferol  100 MCG (4000 UT) TABS Take  4,000 Units by mouth daily.     famotidine  (PEPCID ) 40 MG tablet Take 1 tablet (40 mg total) by mouth daily. 100 tablet 3   fluticasone  (FLONASE ) 50 MCG/ACT nasal spray SHAKE LIQUID AND USE 2 SPRAYS IN EACH NOSTRIL DAILY 48 g 2   folic acid  (FOLVITE ) 1 MG tablet Take 1 tablet (1 mg total) by mouth daily. 30 tablet 11   hydrOXYzine  (VISTARIL ) 25 MG capsule Take 1 capsule (25 mg total) by mouth every 8 (eight) hours as needed for anxiety. 14 capsule 0   meloxicam  (MOBIC ) 15 MG tablet TAKE 1 TABLET BY MOUTH DAILY AS NEEDED FOR KNEE PAIN 90 tablet 0   Multiple Vitamin (MULTIVITAMIN) tablet Take 1 tablet by mouth daily.     zinc gluconate 50 MG tablet Take 50 mg by mouth  daily.     No current facility-administered medications on file prior to visit.    Review of Systems:  As per HPI- otherwise negative.   Physical Examination: There were no vitals filed for this visit. There were no vitals filed for this visit. There is no height or weight on file to calculate BMI. Ideal Body Weight:    GEN: no acute distress. HEENT: Atraumatic, Normocephalic.  Ears and Nose: No external deformity. CV: RRR, No M/G/R. No JVD. No thrill. No extra heart sounds. PULM: CTA B, no wheezes, crackles, rhonchi. No retractions. No resp. distress. No accessory muscle use. ABD: S, NT, ND, +BS. No rebound. No HSM. EXTR: No c/c/e PSYCH: Normally interactive. Conversant.    Assessment and Plan: No diagnosis found.  Assessment & Plan   Signed Harlene Schroeder, MD

## 2024-06-01 NOTE — Patient Instructions (Incomplete)
 It was good to see you today, I will be in touch with your labs Recommend COVID booster this fall Flu shot today Pap and cologuard both due next year Recommend the shingirx vaccine series and pneumonia vaccine at your convenience  We will get you started with a GLP-1 drug for weight loss and diabetes control  Rx sent in for Mounjaro 2.5 weekly- we can go up on the dosage as needed for weight loss.  Please let me know how this is working for you and when you are ready to go up

## 2024-06-05 ENCOUNTER — Ambulatory Visit (INDEPENDENT_AMBULATORY_CARE_PROVIDER_SITE_OTHER): Admitting: Family Medicine

## 2024-06-05 ENCOUNTER — Encounter: Payer: Self-pay | Admitting: Family Medicine

## 2024-06-05 VITALS — BP 138/86 | HR 74 | Temp 98.3°F | Ht 65.0 in | Wt 234.4 lb

## 2024-06-05 DIAGNOSIS — I10 Essential (primary) hypertension: Secondary | ICD-10-CM | POA: Diagnosis not present

## 2024-06-05 DIAGNOSIS — Z1329 Encounter for screening for other suspected endocrine disorder: Secondary | ICD-10-CM

## 2024-06-05 DIAGNOSIS — D500 Iron deficiency anemia secondary to blood loss (chronic): Secondary | ICD-10-CM

## 2024-06-05 DIAGNOSIS — Z23 Encounter for immunization: Secondary | ICD-10-CM

## 2024-06-05 DIAGNOSIS — E119 Type 2 diabetes mellitus without complications: Secondary | ICD-10-CM | POA: Diagnosis not present

## 2024-06-05 DIAGNOSIS — D563 Thalassemia minor: Secondary | ICD-10-CM | POA: Diagnosis not present

## 2024-06-05 DIAGNOSIS — Z7985 Long-term (current) use of injectable non-insulin antidiabetic drugs: Secondary | ICD-10-CM

## 2024-06-05 DIAGNOSIS — Z0000 Encounter for general adult medical examination without abnormal findings: Secondary | ICD-10-CM

## 2024-06-05 DIAGNOSIS — I83812 Varicose veins of left lower extremities with pain: Secondary | ICD-10-CM

## 2024-06-05 DIAGNOSIS — R21 Rash and other nonspecific skin eruption: Secondary | ICD-10-CM

## 2024-06-05 MED ORDER — AMLODIPINE BESYLATE 5 MG PO TABS: 5.0000 mg | ORAL_TABLET | Freq: Every day | ORAL | 3 refills | Status: AC

## 2024-06-05 MED ORDER — TIRZEPATIDE 2.5 MG/0.5ML ~~LOC~~ SOAJ: 2.5000 mg | SUBCUTANEOUS | 1 refills | Status: DC

## 2024-06-05 MED ORDER — HYDROXYZINE PAMOATE 25 MG PO CAPS: 25.0000 mg | ORAL_CAPSULE | Freq: Three times a day (TID) | ORAL | 1 refills | Status: AC | PRN

## 2024-06-05 MED ORDER — CARVEDILOL 12.5 MG PO TABS: 12.5000 mg | ORAL_TABLET | Freq: Two times a day (BID) | ORAL | 3 refills | Status: AC

## 2024-06-06 ENCOUNTER — Encounter: Payer: Self-pay | Admitting: Family

## 2024-06-06 ENCOUNTER — Encounter: Payer: Self-pay | Admitting: Family Medicine

## 2024-06-06 ENCOUNTER — Other Ambulatory Visit (HOSPITAL_COMMUNITY): Payer: Self-pay

## 2024-06-06 ENCOUNTER — Telehealth: Payer: Self-pay

## 2024-06-06 DIAGNOSIS — E119 Type 2 diabetes mellitus without complications: Secondary | ICD-10-CM

## 2024-06-06 DIAGNOSIS — E785 Hyperlipidemia, unspecified: Secondary | ICD-10-CM

## 2024-06-06 LAB — LIPID PANEL
Cholesterol: 181 mg/dL (ref 0–200)
HDL: 52.4 mg/dL (ref >39.00)
LDL Cholesterol: 115 mg/dL — ABNORMAL HIGH (ref 0–99)
NonHDL: 128.36
Total CHOL/HDL Ratio: 3
Triglycerides: 69 mg/dL (ref 0.0–149.0)
VLDL: 13.8 mg/dL (ref 0.0–40.0)

## 2024-06-06 LAB — COMPREHENSIVE METABOLIC PANEL WITH GFR
ALT: 10 U/L (ref 0–35)
AST: 15 U/L (ref 0–37)
Albumin: 4.1 g/dL (ref 3.5–5.2)
Alkaline Phosphatase: 50 U/L (ref 39–117)
BUN: 11 mg/dL (ref 6–23)
CO2: 26 meq/L (ref 19–32)
Calcium: 9 mg/dL (ref 8.4–10.5)
Chloride: 102 meq/L (ref 96–112)
Creatinine, Ser: 0.67 mg/dL (ref 0.40–1.20)
GFR: 101.89 mL/min (ref >60.00)
Glucose, Bld: 99 mg/dL (ref 70–99)
Potassium: 3.7 meq/L (ref 3.5–5.1)
Sodium: 137 meq/L (ref 135–145)
Total Bilirubin: 0.6 mg/dL (ref 0.2–1.2)
Total Protein: 7 g/dL (ref 6.0–8.3)

## 2024-06-06 LAB — TSH: TSH: 0.21 u[IU]/mL — ABNORMAL LOW (ref 0.35–5.50)

## 2024-06-06 LAB — MICROALBUMIN / CREATININE URINE RATIO
Creatinine,U: 136 mg/dL
Microalb Creat Ratio: 17.2 mg/g (ref 0.0–30.0)
Microalb, Ur: 2.3 mg/dL — ABNORMAL HIGH (ref 0.0–1.9)

## 2024-06-06 LAB — HEMOGLOBIN A1C: Hgb A1c MFr Bld: 7 % — ABNORMAL HIGH (ref 4.6–6.5)

## 2024-06-06 NOTE — Telephone Encounter (Signed)
 Needs PA for Mounjaro  please.

## 2024-06-06 NOTE — Telephone Encounter (Signed)
 Pharmacy Patient Advocate Encounter   Received notification from Pt Calls Messages that prior authorization for Mounjaro 2.5mg /0.8ml is required/requested.   Insurance verification completed.   The patient is insured through [employer].   Per test claim: PA required; PA submitted to above mentioned insurance via Fax Key/confirmation #/EOC -- Status is pending   Fax# [phone]

## 2024-06-06 NOTE — Addendum Note (Signed)
 Addended by: WATT RAISIN C on: 06/06/2024 05:42 PM   Modules accepted: Orders

## 2024-06-10 NOTE — Telephone Encounter (Signed)
 Additional information has been requested from the patient's insurance in order to proceed with the prior authorization request. Requested information has been sent, or form has been filled out and faxed back to [phone]  Case: [id number]

## 2024-06-11 MED ORDER — ROSUVASTATIN CALCIUM 10 MG PO TABS
10.0000 mg | ORAL_TABLET | Freq: Every day | ORAL | 3 refills | Status: AC
Start: 2024-06-11 — End: ?

## 2024-06-11 NOTE — Telephone Encounter (Signed)
 Pharmacy Patient Advocate Encounter  Received notification from [employer] that Prior Authorization for Mounjaro 2.5mg /0.44ml has been DENIED.  Full denial letter will be uploaded to the media tab. See denial reason below.   PA #/Case ID/Reference #: [id number]

## 2024-06-13 ENCOUNTER — Other Ambulatory Visit: Payer: Self-pay | Admitting: *Deleted

## 2024-06-13 DIAGNOSIS — M7989 Other specified soft tissue disorders: Secondary | ICD-10-CM

## 2024-06-16 ENCOUNTER — Encounter: Payer: Self-pay | Admitting: Family Medicine

## 2024-06-16 DIAGNOSIS — E119 Type 2 diabetes mellitus without complications: Secondary | ICD-10-CM

## 2024-06-17 ENCOUNTER — Telehealth: Payer: Self-pay

## 2024-06-17 ENCOUNTER — Other Ambulatory Visit (HOSPITAL_COMMUNITY): Payer: Self-pay

## 2024-06-17 MED ORDER — OZEMPIC (0.25 OR 0.5 MG/DOSE) 2 MG/3ML ~~LOC~~ SOPN: PEN_INJECTOR | SUBCUTANEOUS | 1 refills | Status: DC

## 2024-06-17 NOTE — Telephone Encounter (Signed)
 Pharmacy Patient Advocate Encounter   Received notification from [employer] that prior authorization for Ozempic (0.25 or 0.5 MG/DOSE) 2MG /3ML pen-injectors  is required/requested.   Insurance verification completed.   The patient is insured through [employer].   Per test claim: PA required; PA submitted to above mentioned insurance via Latent Key/confirmation #/EOC [id number] Status is pending

## 2024-06-18 ENCOUNTER — Ambulatory Visit: Admitting: Physician Assistant

## 2024-06-18 ENCOUNTER — Ambulatory Visit (HOSPITAL_COMMUNITY)
Admission: RE | Admit: 2024-06-18 | Discharge: 2024-06-18 | Disposition: A | Discharge status: Home / Self Care | Source: Ambulatory Visit | Attending: Vascular Surgery | Admitting: Vascular Surgery

## 2024-06-18 ENCOUNTER — Encounter: Payer: Self-pay | Admitting: Physician Assistant

## 2024-06-18 ENCOUNTER — Other Ambulatory Visit (HOSPITAL_COMMUNITY): Payer: Self-pay

## 2024-06-18 VITALS — BP 139/82 | HR 75 | Temp 98.3°F | Resp 18 | Ht 65.0 in | Wt 232.3 lb

## 2024-06-18 DIAGNOSIS — I8392 Asymptomatic varicose veins of left lower extremity: Secondary | ICD-10-CM

## 2024-06-18 DIAGNOSIS — I872 Venous insufficiency (chronic) (peripheral): Secondary | ICD-10-CM | POA: Insufficient documentation

## 2024-06-18 DIAGNOSIS — M7989 Other specified soft tissue disorders: Secondary | ICD-10-CM | POA: Diagnosis not present

## 2024-06-18 DIAGNOSIS — I8393 Asymptomatic varicose veins of bilateral lower extremities: Secondary | ICD-10-CM

## 2024-06-18 NOTE — Telephone Encounter (Signed)
 Pharmacy Patient Advocate Encounter  Received notification from [employer] that Prior Authorization for Ozempic (0.25 or 0.5 MG/DOSE) 2MG /3ML pen-injectors   has been APPROVED from 06/18/2024 to 06/18/2025. Ran test claim, Copay is $4.00. This test claim was processed through [facility]- copay amounts may vary at other pharmacies due to pharmacy/plan contracts, or as the patient moves through the different stages of their insurance plan.   PA #/Case ID/Reference #: [id number]

## 2024-06-18 NOTE — Progress Notes (Signed)
 Requested by:  Watt Harlene BROCKS, MD [address] [place],  [place] [address]  Reason for consultation: painful varicose veins    History of Present Illness   [name] is a 48 y.o. ([birth year]) female who presents for evaluation of BLE pain and visible varicose veins. She explains that they have been progressing since her last pregnancy 12 years ago. She says her legs mostly bother her on prolonged standing. 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 She does elevate occasionally and has OTC compression stockings but does not uses them very often. She reports no history of DVT. No family history of venous disease.  Venous symptoms include: aching, tired Onset/duration:  > 12 years  Occupation:  Charity fundraiser Aggravating factors: prolonged standing Alleviating factors: elevation Compression:  yes Helps:  no Pain medications:  no Previous vein procedures:  no History of DVT:  no  Past Medical History:  Diagnosis Date   Beta thalassemia trait    Essential hypertension, benign 06/12/2012   [facility] abstract note also started on two  new meds for this see med list.   No pertinent past medical history     Past Surgical History:  Procedure Laterality Date   BREAST BIOPSY Left    CESAREAN SECTION     CESAREAN SECTION  04/17/2012   Procedure: CESAREAN SECTION;  Surgeon: Carlin DELENA Centers, MD;  Location: [facility];  Service: Gynecology;  Laterality: N/A;    Social History   Socioeconomic History   Marital status: Married    Spouse name: Not on file   Number of children: Not on file   Years of education: Not on file   Highest education level: Associate degree: occupational, Scientist, product/process development, or vocational program  Occupational History   Not on file  Tobacco Use   Smoking status: Never   Smokeless tobacco: Never  Vaping Use   Vaping status: Never Used  Substance and Sexual Activity    Alcohol use: No   Drug use: No   Sexual activity: Yes  Other Topics Concern   Not on file  Social History Narrative   Not on file   Social Drivers of Health   Financial Resource Strain: Low Risk  (02/02/2023)   Overall Financial Resource Strain (CARDIA)    Difficulty of Paying Living Expenses: Not hard at all  Food Insecurity: No Food Insecurity (09/26/2023)   Hunger Vital Sign    Worried About Running Out of Food in the Last Year: Never true    Ran Out of Food in the Last Year: Never true  Transportation Needs: No Transportation Needs (09/26/2023)   PRAPARE - Administrator, Civil Service (Medical): No    Lack of Transportation (Non-Medical): No  Physical Activity: Unknown (02/02/2023)   Exercise Vital Sign    Days of Exercise per Week: 4 days    Minutes of Exercise per Session: Patient declined  Stress: No Stress Concern Present (02/02/2023)   [employer] of Occupational Health - Occupational Stress Questionnaire    Feeling of Stress : Not at all  Social Connections: Unknown (02/02/2023)   Social Connection and Isolation Panel    Frequency of Communication with Friends and Family: More than three times a week    Frequency of Social Gatherings with Friends and Family: More than three times a week    Attends Religious Services: Patient declined  Active Member of Clubs or Organizations: Patient declined    Attends Banker Meetings: Not on file    Marital Status: Patient declined  Intimate Partner Violence: Not At Risk (11/02/2022)   Humiliation, Afraid, Rape, and Kick questionnaire    Fear of Current or Ex-Partner: No    Emotionally Abused: No    Physically Abused: No    Sexually Abused: No    Family History  Problem Relation Age of Onset   Hypertension Mother    Sickle cell anemia Brother    BRCA 1/2 Neg Hx    Breast cancer Neg Hx     Current Outpatient Medications  Medication Sig Dispense Refill   amLODipine  (NORVASC ) 5 MG tablet Take 1  tablet (5 mg total) by mouth daily. 90 tablet 3   carvedilol  (COREG ) 12.5 MG tablet Take 1 tablet (12.5 mg total) by mouth 2 (two) times daily with a meal. 180 tablet 3   Cholecalciferol  100 MCG (4000 UT) TABS Take 4,000 Units by mouth daily.     famotidine  (PEPCID ) 40 MG tablet Take 1 tablet (40 mg total) by mouth daily. 100 tablet 3   fluticasone  (FLONASE ) 50 MCG/ACT nasal spray SHAKE LIQUID AND USE 2 SPRAYS IN EACH NOSTRIL DAILY 48 g 2   folic acid  (FOLVITE ) 1 MG tablet Take 1 tablet (1 mg total) by mouth daily. 30 tablet 11   hydrOXYzine  (VISTARIL ) 25 MG capsule Take 1 capsule (25 mg total) by mouth every 8 (eight) hours as needed for itching. 30 capsule 1   meloxicam  (MOBIC ) 15 MG tablet TAKE 1 TABLET BY MOUTH DAILY AS NEEDED FOR KNEE PAIN (Patient taking differently: Take 15 mg by mouth daily as needed for pain.) 90 tablet 0   Multiple Vitamin (MULTIVITAMIN) tablet Take 1 tablet by mouth daily.     rosuvastatin (CRESTOR) 10 MG tablet Take 1 tablet (10 mg total) by mouth daily. 90 tablet 3   Semaglutide,0.25 or 0.5MG /DOS, (OZEMPIC, 0.25 OR 0.5 MG/DOSE,) 2 MG/3ML SOPN Inject 0.25 mg weekly for 4 weeks, then increase to 0.5 mg weekly 3 mL 1   zinc gluconate 50 MG tablet Take 50 mg by mouth daily.     No current facility-administered medications for this visit.    Allergies  Allergen Reactions   Beet [Beta Vulgaris] Hives, Itching and Rash    Fluid filled blisters   Amoxicillin Rash    REVIEW OF SYSTEMS (negative unless checked):   Cardiac:  []  Chest pain or chest pressure? []  Shortness of breath upon activity? []  Shortness of breath when lying flat? []  Irregular heart rhythm?  Vascular:  []  Pain in calf, thigh, or hip brought on by walking? []  Pain in feet at night that wakes you up from your sleep? []  Blood clot in your veins? []  Leg swelling?  Pulmonary:  []  Oxygen at home? []  Productive cough? []  Wheezing?  Neurologic:  []  Sudden weakness in arms or legs? []  Sudden  numbness in arms or legs? []  Sudden onset of difficult speaking or slurred speech? []  Temporary loss of vision in one eye? []  Problems with dizziness?  Gastrointestinal:  []  Blood in stool? []  Vomited blood?  Genitourinary:  []  Burning when urinating? []  Blood in urine?  Psychiatric:  []  Major depression  Hematologic:  []  Bleeding problems? []  Problems with blood clotting?  Dermatologic:  []  Rashes or ulcers?  Constitutional:  []  Fever or chills?  Ear/Nose/Throat:  []  Change in hearing? []  Nose bleeds? []  Sore throat?  Musculoskeletal:  []   Back pain? []  Joint pain? []  Muscle pain?   Physical Examination     Vitals:   06/18/24 1321  BP: 139/82  Pulse: 75  Resp: 18  Temp: 98.3 F (36.8 C)  TempSrc: Temporal  Weight: 232 lb 4.8 oz (105.4 kg)  Height: 5' 5 (1.651 m)   Body mass index is 38.66 kg/m.  General:  WDWN in NAD; vital signs documented above Gait: Normal HENT: WNL, normocephalic Pulmonary: normal non-labored breathing Cardiac: regular HR Abdomen: soft Vascular Exam/Pulses: Extremities: with varicose veins, with reticular veins, without edema, without stasis pigmentation, without lipodermatosclerosis, without ulcers Musculoskeletal: no muscle wasting or atrophy  Neurologic: A&O X 3;  No focal weakness or paresthesias are detected Psychiatric:  The pt has Normal affect.  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 female who presents painful varicose veins of LLE. Duplex shows no DVT or SVT. She has no incompetent veins of the LLE. She does have varicosities of the left thigh that are incompetent. I did discuss sclerotherapy of her reticular veins as a treatment option however she has very  extensive reticular veins and this would require quite a lot of injections. I discussed that this is considered cosmetic treatment and therefore out of pocket. I provided her with RN Inocente Perks card if she wishes to pursue this route in the future. Based on the patient's history and examination, I recommend: daily elevation of 20-30 minutes above level of heart, daily compression stocking use, exercise, weight reduction, refraining from prolonged sitting or standing. I discussed with the patient the use of her 15-20 mm knee high compression stockings Patient was provided with information about vein health She can otherwise follow up as needed if she has any new or worsening symptoms   Teretha Damme, PA-C [facility] Office: [phone]  06/18/2024, 1:45 PM  Clinic MD:  [place]

## 2024-07-25 ENCOUNTER — Inpatient Hospital Stay (HOSPITAL_BASED_OUTPATIENT_CLINIC_OR_DEPARTMENT_OTHER): Admitting: Medical Oncology

## 2024-07-25 ENCOUNTER — Inpatient Hospital Stay: Attending: Medical Oncology

## 2024-07-25 ENCOUNTER — Inpatient Hospital Stay

## 2024-07-25 VITALS — BP 137/91 | HR 68 | Temp 97.1°F | Resp 18

## 2024-07-25 VITALS — BP 128/82 | HR 66 | Temp 97.7°F | Resp 16 | Wt 225.1 lb

## 2024-07-25 DIAGNOSIS — R0602 Shortness of breath: Secondary | ICD-10-CM | POA: Insufficient documentation

## 2024-07-25 DIAGNOSIS — R002 Palpitations: Secondary | ICD-10-CM | POA: Insufficient documentation

## 2024-07-25 DIAGNOSIS — D500 Iron deficiency anemia secondary to blood loss (chronic): Secondary | ICD-10-CM | POA: Diagnosis not present

## 2024-07-25 DIAGNOSIS — N920 Excessive and frequent menstruation with regular cycle: Secondary | ICD-10-CM | POA: Insufficient documentation

## 2024-07-25 DIAGNOSIS — D563 Thalassemia minor: Secondary | ICD-10-CM | POA: Insufficient documentation

## 2024-07-25 DIAGNOSIS — Z86018 Personal history of other benign neoplasm: Secondary | ICD-10-CM | POA: Insufficient documentation

## 2024-07-25 DIAGNOSIS — R61 Generalized hyperhidrosis: Secondary | ICD-10-CM | POA: Diagnosis not present

## 2024-07-25 DIAGNOSIS — R5383 Other fatigue: Secondary | ICD-10-CM | POA: Insufficient documentation

## 2024-07-25 LAB — IRON AND IRON BINDING CAPACITY (CC-WL,HP ONLY)
Iron: 44 ug/dL (ref 28–170)
Saturation Ratios: 13 % (ref 10.4–31.8)
TIBC: 347 ug/dL (ref 250–450)
UIBC: 303 ug/dL

## 2024-07-25 LAB — CBC
HCT: 33.2 % — ABNORMAL LOW (ref 36.0–46.0)
Hemoglobin: 10.3 g/dL — ABNORMAL LOW (ref 12.0–15.0)
MCH: 21.4 pg — ABNORMAL LOW (ref 26.0–34.0)
MCHC: 31 g/dL (ref 30.0–36.0)
MCV: 68.9 fL — ABNORMAL LOW (ref 80.0–100.0)
Platelets: 228 K/uL (ref 150–400)
RBC: 4.82 MIL/uL (ref 3.87–5.11)
RDW: 17.2 % — ABNORMAL HIGH (ref 11.5–15.5)
WBC: 4.8 K/uL (ref 4.0–10.5)
nRBC: 0 % (ref 0.0–0.2)

## 2024-07-25 LAB — FERRITIN: Ferritin: 37 ng/mL (ref 11–307)

## 2024-07-25 MED ORDER — SODIUM CHLORIDE 0.9 % IV SOLN: Freq: Once | INTRAVENOUS | Status: AC

## 2024-07-25 MED ORDER — IRON SUCROSE 300 MG IVPB - SIMPLE MED
300.0000 mg | Freq: Once | Status: AC
  Administered 2024-07-25: 300 mg via INTRAVENOUS
  Filled 2024-07-25: qty 300

## 2024-07-25 NOTE — Patient Instructions (Signed)

## 2024-07-25 NOTE — Progress Notes (Signed)
 Hematology and Oncology Follow Up Visit  [name] [id number] 05/08/1976 48 y.o. 07/25/2024   Principle Diagnosis:  Iron  deficiency anemia - heavy menses  Beta Thalassemia trait   Current Therapy:        IV iron  as indicated- Venofer - last dose 10/09/2023 Folic acid  1 mg PO daily    Interim History:  [name] is here today for follow-up.   Unfortunately she has had menstrual bleeding for 1 month. Heavy daily bleeding with clots. Previously her IUD was very helpful but now she has not had any relief in bleeding.  She has been seen by GYN. She has had uterine ultrasound which showed fibroids.They tried to obtain a uterine biopsy but this was unsuccessful x 2.  Fatigue is present. Palpitations have returned. SOB with exertion is present. No chest pains. She has noticed some night sweats. No unintentional weight loss. No new pains.  Colonoscopy- Cologuard- 2023 Negative She is taking her folic acid .  No other blood loss noted.  No bruising or petechiae.  No fever, chills, n/v, cough, rash, chest pain,  abdominal pain or changes in bowel or bladder habits.  No falls or syncope reported.  Appetite and hydration are good.   ECOG Performance Status: 0 - Asymptomatic Wt Readings from Last 3 Encounters:  07/25/24 225 lb 1.3 oz (102.1 kg)  06/18/24 232 lb 4.8 oz (105.4 kg)  06/05/24 234 lb 6.4 oz (106.3 kg)    Medications:  Allergies as of 07/25/2024       Reactions   Beet [beta Vulgaris] Hives, Itching, Rash   Fluid filled blisters   Amoxicillin Rash        Medication List        Accurate as of July 25, 2024 10:28 AM. If you have any questions, ask your nurse or doctor.          amLODipine  5 MG tablet Commonly known as: NORVASC  Take 1 tablet (5 mg total) by mouth daily.   carvedilol  12.5 MG tablet Commonly known as: COREG  Take 1 tablet (12.5 mg total) by mouth 2 (two) times daily with a meal.   Cholecalciferol  100 MCG (4000 UT) Tabs Take 4,000  Units by mouth daily.   famotidine  40 MG tablet Commonly known as: Pepcid  Take 1 tablet (40 mg total) by mouth daily.   fluticasone  50 MCG/ACT nasal spray Commonly known as: FLONASE  SHAKE LIQUID AND USE 2 SPRAYS IN EACH NOSTRIL DAILY   folic acid  1 MG tablet Commonly known as: FOLVITE  Take 1 tablet (1 mg total) by mouth daily.   hydrOXYzine  25 MG capsule Commonly known as: VISTARIL  Take 1 capsule (25 mg total) by mouth every 8 (eight) hours as needed for itching.   meloxicam  15 MG tablet Commonly known as: MOBIC  TAKE 1 TABLET BY MOUTH DAILY AS NEEDED FOR KNEE PAIN What changed: See the new instructions.   multivitamin tablet Take 1 tablet by mouth daily.   Ozempic (0.25 or 0.5 MG/DOSE) 2 MG/3ML Sopn Generic drug: Semaglutide(0.25 or 0.5MG /DOS) Inject 0.25 mg weekly for 4 weeks, then increase to 0.5 mg weekly   rosuvastatin 10 MG tablet Commonly known as: Crestor Take 1 tablet (10 mg total) by mouth daily.   zinc gluconate 50 MG tablet Take 50 mg by mouth daily.        Allergies:  Allergies  Allergen Reactions   Beet [Beta Vulgaris] Hives, Itching and Rash    Fluid filled blisters   Amoxicillin Rash    Past Medical History,  Surgical history, Social history, and Family History were reviewed and updated.  Review of Systems: All other 10 point review of systems is negative.   Physical Exam:  weight is 225 lb 1.3 oz (102.1 kg). Her oral temperature is 97.7 F (36.5 C). Her blood pressure is 128/82 and her pulse is 66. Her respiration is 16 and oxygen saturation is 100%.   Wt Readings from Last 3 Encounters:  07/25/24 225 lb 1.3 oz (102.1 kg)  06/18/24 232 lb 4.8 oz (105.4 kg)  06/05/24 234 lb 6.4 oz (106.3 kg)    Ocular: Sclerae unicteric, pupils equal, round and reactive to light Ear-nose-throat: Oropharynx clear, dentition fair Lymphatic: No cervical or supraclavicular adenopathy Lungs no rales or rhonchi, good excursion bilaterally Heart regular rate  and rhythm, no murmur appreciated Abd soft, nontender, positive bowel sounds MSK no focal spinal tenderness, no joint edema Neuro: non-focal, well-oriented, appropriate affect   Lab Results  Component Value Date   WBC 4.7 04/24/2024   HGB 11.5 (L) 04/24/2024   HCT 37.2 04/24/2024   MCV 67.5 (L) 04/24/2024   PLT 230 04/24/2024   Lab Results  Component Value Date   FERRITIN 154 04/24/2024   IRON  72 04/24/2024   TIBC 336 04/24/2024   UIBC 264 04/24/2024   IRONPCTSAT 21 04/24/2024   Lab Results  Component Value Date   RETICCTPCT 1.1 04/24/2024   RBC 5.51 (H) 04/24/2024   RBC 5.51 (H) 04/24/2024   RETICCTABS 46.10 11/29/2010   No results found for: KPAFRELGTCHN, LAMBDASER, KAPLAMBRATIO No results found for: IGGSERUM, IGA, IGMSERUM No results found for: STEPHANY CARLOTA BENSON MARKEL EARLA JOANNIE DOC VICK, SPEI   Chemistry      Component Value Date/Time   NA 137 06/05/2024 1031   NA 136 07/27/2022 1010   K 3.7 06/05/2024 1031   CL 102 06/05/2024 1031   CO2 26 06/05/2024 1031   BUN 11 06/05/2024 1031   BUN 10 07/27/2022 1010   CREATININE 0.67 06/05/2024 1031   CREATININE 0.81 06/24/2016 1015      Component Value Date/Time   CALCIUM 9.0 06/05/2024 1031   ALKPHOS 50 06/05/2024 1031   AST 15 06/05/2024 1031   ALT 10 06/05/2024 1031   BILITOT 0.6 06/05/2024 1031   BILITOT 0.4 07/27/2022 1010      Encounter Diagnoses  Name Primary?   Iron  deficiency anemia due to chronic blood loss Yes   Beta thalassemia trait    Impression and Plan: [name] is a very pleasant 48 yo African American female with history of both iron  deficiency anemia secondary to heavy menses as well as the beta thalassemia trait.   Hgb is 10.3. Given bleeding and Hgb she likely needs IV iron . We will get her in for this today.  Iron  studies pending Continue GYN follow up and work up closely  Continue Folic acid  1 mg PO daily.   RTC weekly for IV  iron  x3 total(1st given today) RTC 3 months APP, labs (CBC, iron , ferritin)  Lauraine CHRISTELLA Dais, PA-C 11/20/202510:28 AM

## 2024-07-26 ENCOUNTER — Telehealth: Payer: Self-pay | Admitting: Medical Oncology

## 2024-07-26 NOTE — Telephone Encounter (Signed)
 Lvm for patient to return call for scheduling 2 more doses of iron .

## 2024-07-31 ENCOUNTER — Inpatient Hospital Stay

## 2024-07-31 ENCOUNTER — Inpatient Hospital Stay: Admitting: Licensed Clinical Social Worker

## 2024-07-31 VITALS — BP 128/85 | HR 67 | Temp 98.7°F | Resp 16

## 2024-07-31 DIAGNOSIS — D500 Iron deficiency anemia secondary to blood loss (chronic): Secondary | ICD-10-CM | POA: Diagnosis not present

## 2024-07-31 MED ORDER — SODIUM CHLORIDE 0.9 % IV SOLN: Freq: Once | INTRAVENOUS | Status: AC

## 2024-07-31 MED ORDER — IRON SUCROSE 300 MG IVPB - SIMPLE MED
300.0000 mg | Freq: Once | Status: AC
  Administered 2024-07-31: 300 mg via INTRAVENOUS
  Filled 2024-07-31: qty 300

## 2024-07-31 NOTE — Patient Instructions (Signed)

## 2024-07-31 NOTE — Progress Notes (Signed)
 [facility] Clinical Social Work  Clinical Social Work met with patient in infusion for assessment of psychosocial needs.  Clinical Social Worker offered support and assess for needs.     Interventions: Patient displayed a bright affect and answered questions.  She stated she did not have any needs at this time.      Follow Up Plan:  Patient will contact CSW with any support or resource needs    Macario CHRISTELLA Au, LCSW  Clinical Social Worker [facility]        Patient is participating in a Managed Medicaid Plan:  Yes

## 2024-08-09 ENCOUNTER — Inpatient Hospital Stay: Attending: Medical Oncology

## 2024-08-09 VITALS — BP 110/79 | HR 69 | Temp 98.1°F | Resp 18

## 2024-08-09 DIAGNOSIS — N920 Excessive and frequent menstruation with regular cycle: Secondary | ICD-10-CM | POA: Insufficient documentation

## 2024-08-09 DIAGNOSIS — D563 Thalassemia minor: Secondary | ICD-10-CM | POA: Insufficient documentation

## 2024-08-09 DIAGNOSIS — D500 Iron deficiency anemia secondary to blood loss (chronic): Secondary | ICD-10-CM | POA: Insufficient documentation

## 2024-08-09 DIAGNOSIS — Z86018 Personal history of other benign neoplasm: Secondary | ICD-10-CM | POA: Insufficient documentation

## 2024-08-09 DIAGNOSIS — R002 Palpitations: Secondary | ICD-10-CM | POA: Insufficient documentation

## 2024-08-09 DIAGNOSIS — R61 Generalized hyperhidrosis: Secondary | ICD-10-CM | POA: Insufficient documentation

## 2024-08-09 DIAGNOSIS — R0602 Shortness of breath: Secondary | ICD-10-CM | POA: Insufficient documentation

## 2024-08-09 DIAGNOSIS — R5383 Other fatigue: Secondary | ICD-10-CM | POA: Insufficient documentation

## 2024-08-09 MED ORDER — IRON SUCROSE 300 MG IVPB - SIMPLE MED
300.0000 mg | Freq: Once | Status: AC
  Administered 2024-08-09: 300 mg via INTRAVENOUS
  Filled 2024-08-09: qty 300

## 2024-08-09 MED ORDER — SODIUM CHLORIDE 0.9 % IV SOLN: Freq: Once | INTRAVENOUS | Status: AC

## 2024-08-09 NOTE — Patient Instructions (Signed)

## 2024-09-11 ENCOUNTER — Ambulatory Visit: Payer: Self-pay | Admitting: Family Medicine

## 2024-09-11 NOTE — Telephone Encounter (Signed)
 FYI Only or Action Required?: Action required by provider: Requesting cough medicine.  Patient was last seen in primary care on 06/05/2024 by Copland, Harlene BROCKS, MD.  Called Nurse Triage reporting Cough.  Symptoms began yesterday.  Interventions attempted: OTC medications: Mucinex, cough drops.  Symptoms are: stable.  Triage Disposition: Home Care  Patient/caregiver understands and will follow disposition?: No Reason for Disposition  Cough  Answer Assessment - Initial Assessment Questions Mucinex, Cough drops. Advised home care. patient stated usually when this happens PCP jsends something stronger over for cough. Requesting cough medicine to pharmacy on file. Please advise.   1. ONSET: When did the cough begin?      3 days ago sore throat, cough yesterday  2. SEVERITY: How bad is the cough today?      Worsening  3. SPUTUM: Describe the color of your sputum (e.g., none, dry cough; clear, white, yellow, green)     Thick mucous  4. HEMOPTYSIS: Are you coughing up any blood? If Yes, ask: How much? (e.g., flecks, streaks, tablespoons, etc.)     Denies  5. DIFFICULTY BREATHING: Are you having difficulty breathing? If Yes, ask: How bad is it? (e.g., mild, moderate, severe)      Denies, 98.9  6. FEVER: Do you have a fever? If Yes, ask: What is your temperature, how was it measured, and when did it start?     Denies  7. CARDIAC HISTORY: Do you have any history of heart disease? (e.g., heart attack, congestive heart failure)      Hypertension  8. LUNG HISTORY: Do you have any history of lung disease?  (e.g., pulmonary embolus, asthma, emphysema)     Denies  10. OTHER SYMPTOMS: Do you have any other symptoms? (e.g., runny nose, wheezing, chest pain)       Chest pain with coughing, sore throat, cold chills  11. TRAVEL: Have you traveled out of the country in the last month? (e.g., travel history, exposures)       Denies  Protocols used: Cough - Acute  Productive-A-AH  Copied from CRM #[id number]. Topic: Clinical - Red Word Triage >> Sep 11, 2024 11:08 AM Carlyon D wrote: Red Word that prompted transfer to Nurse Triage:   productive cough, throat is sore and chest pain due to cough she believes

## 2024-09-12 ENCOUNTER — Encounter: Payer: Self-pay | Admitting: Family Medicine

## 2024-09-12 NOTE — Telephone Encounter (Signed)
 Pt sent a detailed Mychart message to PCP.

## 2024-09-13 ENCOUNTER — Encounter: Payer: Self-pay | Admitting: Student

## 2024-09-13 ENCOUNTER — Ambulatory Visit: Admitting: Student

## 2024-09-13 VITALS — BP 128/78 | HR 83 | Temp 98.6°F | Resp 16 | Ht 65.0 in | Wt 212.8 lb

## 2024-09-13 DIAGNOSIS — R051 Acute cough: Secondary | ICD-10-CM

## 2024-09-13 DIAGNOSIS — J069 Acute upper respiratory infection, unspecified: Secondary | ICD-10-CM

## 2024-09-13 DIAGNOSIS — J101 Influenza due to other identified influenza virus with other respiratory manifestations: Secondary | ICD-10-CM | POA: Insufficient documentation

## 2024-09-13 DIAGNOSIS — J988 Other specified respiratory disorders: Secondary | ICD-10-CM

## 2024-09-13 LAB — POCT INFLUENZA A/B
Influenza A, POC: POSITIVE — AB
Influenza B, POC: NEGATIVE

## 2024-09-13 MED ORDER — BENZONATATE 100 MG PO CAPS: 100.0000 mg | ORAL_CAPSULE | Freq: Three times a day (TID) | ORAL | 0 refills | Status: AC | PRN

## 2024-09-13 MED ORDER — GUAIFENESIN ER 600 MG PO TB12: 1200.0000 mg | ORAL_TABLET | Freq: Two times a day (BID) | ORAL | 0 refills | Status: AC

## 2024-09-13 MED ORDER — OSELTAMIVIR PHOSPHATE 75 MG PO CAPS: 75.0000 mg | ORAL_CAPSULE | Freq: Two times a day (BID) | ORAL | 0 refills | Status: AC

## 2024-09-13 NOTE — Telephone Encounter (Signed)
 Pt had OV today 09/13/2024.

## 2024-09-13 NOTE — Progress Notes (Signed)
 Chief Complaint  Patient presents with   Cough    Patient has been having a persistent and mucus producing cough since Monday. She had a fever for a few days. Runny nose, sneezing and headaches.    [name] here for URI complaints.  Patient reports a productive cough that began Monday. Reports 3 days of sore throat, which has been improving since start of symtpoms. She was negative for Covid and FLU on Monday. She works in nursing home. Cough has been worsening and is associated with thick mucus, chest pain r/t coughing, sore throat, and body aches. Denies fever, shortness of breath, or hemoptysis. Has been using Mucinex  and cough drops with limited relief.  No known lung disease.  Patient denies fever, chills, SOB, CP, palpitations, dyspnea, edema, HA, vision changes, N/V/D, abdominal pain, urinary symptoms, rash.  Past Medical History:  Diagnosis Date   Beta thalassemia trait    Essential hypertension, benign 06/12/2012   [facility] abstract note also started on two  new meds for this see med list.   No pertinent past medical history     Objective BP 128/78 (BP Location: Left Arm, Patient Position: Sitting)   Pulse 83   Temp 98.6 F (37 C) (Oral)   Resp 16   Ht 5' 5 (1.651 m)   Wt 212 lb 12.8 oz (96.5 kg)   SpO2 99%   BMI 35.41 kg/m  General: Awake, alert, appears stated age HEENT: AT, [place], ears patent b/l and TM's neg, nares patent w/o discharge, pharynx wild mild erythma and without exudates, MMM Neck: No masses or asymmetry Heart: RRR Lungs: CTAB, no accessory muscle use Psych: Age appropriate judgment and insight, normal mood and affect  Influenza A  Viral URI with cough - Plan: CANCELED: POCT rapid strep A  Acute cough - Plan: POCT Influenza A/B, CANCELED: POCT rapid strep A  Congestion of upper airway  -Positive influenza -Discussed risk and benefits of tamiflu , Pt would like Tamiflu  Rx -Rx- Tamiflu  - Prescribed Tessalon  Perles for  cough - Continue Mucinex  for congestion. - Encouraged hydration to thin mucus. - Advised acetaminophen  or ibuprofen  for body aches and chest pain. - Provided work note for today. Continue to push fluids, practice good hand hygiene, cover mouth when coughing. F/u prn. If starting to experience fevers, shaking, or shortness of breath, seek immediate care. Pt voiced understanding and agreement to the plan.  Regina LITTIE Jolly, DNP, AGNP-C 09/13/2024 11:30 AM

## 2024-09-18 ENCOUNTER — Encounter: Payer: Self-pay | Admitting: Student

## 2024-09-25 ENCOUNTER — Other Ambulatory Visit: Payer: Self-pay | Admitting: Family Medicine

## 2024-09-25 DIAGNOSIS — E119 Type 2 diabetes mellitus without complications: Secondary | ICD-10-CM

## 2024-09-27 ENCOUNTER — Other Ambulatory Visit: Payer: Self-pay

## 2024-09-27 ENCOUNTER — Ambulatory Visit: Admitting: Family Medicine

## 2024-09-27 ENCOUNTER — Encounter: Payer: Self-pay | Admitting: Family Medicine

## 2024-09-27 VITALS — BP 141/92 | HR 72 | Wt 218.8 lb

## 2024-09-27 DIAGNOSIS — Z975 Presence of (intrauterine) contraceptive device: Secondary | ICD-10-CM | POA: Diagnosis not present

## 2024-09-27 DIAGNOSIS — N938 Other specified abnormal uterine and vaginal bleeding: Secondary | ICD-10-CM | POA: Diagnosis not present

## 2024-09-27 LAB — POCT URINALYSIS DIP (DEVICE)
Bilirubin Urine: NEGATIVE
Glucose, UA: NEGATIVE mg/dL
Hgb urine dipstick: NEGATIVE
Ketones, ur: NEGATIVE mg/dL
Leukocytes,Ua: NEGATIVE
Nitrite: NEGATIVE
Protein, ur: NEGATIVE mg/dL
Specific Gravity, Urine: 1.02 (ref 1.005–1.030)
Urobilinogen, UA: 0.2 mg/dL (ref 0.0–1.0)
pH: 7 (ref 5.0–8.0)

## 2024-09-27 MED ORDER — MEDROXYPROGESTERONE ACETATE 10 MG PO TABS: 15.0000 mg | ORAL_TABLET | Freq: Every day | ORAL | 2 refills | Status: AC

## 2024-09-27 NOTE — Progress Notes (Signed)
 "  GYNECOLOGY OFFICE VISIT NOTE  History:   [name] is a 49 y.o. [id number] here today for follow up of DUB.  Last seen 11/16/2024, at that time had EMB and placement of hormonal IUD with progesterone taper EMB without significant cells again, decision made to trial hormonal IUD and see how things go  Today reports she had intermittent light bleeding for about four months Then she had constant bleeding from end of September to end of December, total about 3 months She needed to have an iron  infusion due to anemia  Today no bleeding but wanting to know what options she has   Health Maintenance Due  Topic Date Due   OPHTHALMOLOGY EXAM  03/05/2023   COVID-19 Vaccine (4 - 2025-26 season) 05/06/2024   DTaP/Tdap/Td (3 - Td or Tdap) 07/11/2024   Zoster Vaccines- Shingrix  (2 of 2) 07/31/2024    Past Medical History:  Diagnosis Date   Beta thalassemia trait    Essential hypertension, benign 06/12/2012   [facility] abstract note also started on two  new meds for this see med list.   No pertinent past medical history     Past Surgical History:  Procedure Laterality Date   BREAST BIOPSY Left    CESAREAN SECTION     CESAREAN SECTION  04/17/2012   Procedure: CESAREAN SECTION;  Surgeon: Carlin DELENA Centers, MD;  Location: [facility];  Service: Gynecology;  Laterality: N/A;    The following portions of the patient's history were reviewed and updated as appropriate: allergies, current medications, past family history, past medical history, past social history, past surgical history and problem list.   Health Maintenance:   Last pap: Result Date Procedure Results Follow-ups  01/05/2022 Cytology - PAP High risk HPV: Negative Adequacy: Satisfactory for evaluation; transformation zone component ABSENT. Diagnosis: - Negative for intraepithelial lesion or malignancy (NILM) Comment: Normal Reference Range HPV - Negative     Last mammogram:  BIRADS 1 11/21/23    Review of  Systems:  Pertinent items noted in HPI and remainder of comprehensive ROS otherwise negative.  Physical Exam:  BP (!) 141/92   Pulse 72   Wt 218 lb 12.8 oz (99.2 kg)   BMI 36.41 kg/m  CONSTITUTIONAL: Well-developed, well-nourished female in no acute distress.  HEENT:  Normocephalic, atraumatic. External right and left ear normal. No scleral icterus.  NECK: Normal range of motion, supple, no masses noted on observation SKIN: No rash noted. Not diaphoretic. No erythema. No pallor. MUSCULOSKELETAL: Normal range of motion. No edema noted. NEUROLOGIC: Alert and oriented to person, place, and time. Normal muscle tone coordination.  PSYCHIATRIC: Normal mood and affect. Normal behavior. Normal judgment and thought content. RESPIRATORY: Effort normal, no problems with respiration noted PELVIC: scant amount fo brown discharge, IUD strings visualized about 3-4 cm in length  Labs and Imaging No results found for this or any previous visit (from the past week). No results found.    Assessment and Plan:   Problem List Items Addressed This Visit       Genitourinary   DUB (dysfunctional uterine bleeding) - Primary   Discussed with patient that given she had prolonged bleeding at this point I recommend proceeding with hysteroscopy D&C in the OR. She is also open to considering surgical management given IUD is not fully relieving symptoms. Will refer to Dr. Jeralyn given history of two prior cesareans, multi-fibroid uterus. Also given rx again for Provera  to stop bleeding in case it returns.  Relevant Medications   medroxyPROGESTERone  (PROVERA ) 10 MG tablet     Other   IUD (intrauterine device) in place    Routine preventative health maintenance measures emphasized. Please refer to After Visit Summary for other counseling recommendations.   Return in about 4 weeks (around 10/25/2024) for with Dr. Jeralyn for surgical consult.    Total face-to-face time with patient: 20 minutes.  Over  50% of encounter was spent on counseling and coordination of care.   Regina CHRISTELLA Carolus, MD/MPH Attending Family Medicine Physician, [facility] for [facility], [facility] "

## 2024-09-27 NOTE — Assessment & Plan Note (Addendum)
 Discussed with patient that given she had prolonged bleeding at this point I recommend proceeding with hysteroscopy D&C in the OR. She is also open to considering surgical management given IUD is not fully relieving symptoms. Will refer to Dr. Jeralyn given history of two prior cesareans, multi-fibroid uterus. Also given rx again for Provera  to stop bleeding in case it returns.

## 2024-10-24 ENCOUNTER — Inpatient Hospital Stay

## 2024-10-24 ENCOUNTER — Inpatient Hospital Stay: Admitting: Medical Oncology

## 2024-11-04 ENCOUNTER — Ambulatory Visit: Payer: Self-pay | Admitting: Obstetrics and Gynecology
